# Patient Record
Sex: Female | Born: 1975
Health system: Southern US, Community
[De-identification: ages and names within clinical notes are randomized; demographics above are authoritative.]

## PROBLEM LIST (undated history)

## (undated) DIAGNOSIS — IMO0002 Reserved for concepts with insufficient information to code with codable children: Secondary | ICD-10-CM

## (undated) HISTORY — PX: OTHER SURGICAL HISTORY: SHX169

---

## 1999-03-09 ENCOUNTER — Other Ambulatory Visit: Admission: RE | Admit: 1999-03-09 | Discharge: 1999-03-09 | Payer: Self-pay | Admitting: Obstetrics and Gynecology

## 2000-03-25 ENCOUNTER — Other Ambulatory Visit: Admission: RE | Admit: 2000-03-25 | Discharge: 2000-03-25 | Payer: Self-pay | Admitting: *Deleted

## 2001-08-16 ENCOUNTER — Other Ambulatory Visit: Admission: RE | Admit: 2001-08-16 | Discharge: 2001-08-16 | Payer: Self-pay | Admitting: Obstetrics and Gynecology

## 2003-01-09 ENCOUNTER — Other Ambulatory Visit: Admission: RE | Admit: 2003-01-09 | Discharge: 2003-01-09 | Payer: Self-pay | Admitting: Obstetrics and Gynecology

## 2004-02-21 ENCOUNTER — Other Ambulatory Visit: Admission: RE | Admit: 2004-02-21 | Discharge: 2004-02-21 | Payer: Self-pay | Admitting: Obstetrics and Gynecology

## 2005-09-10 ENCOUNTER — Other Ambulatory Visit: Admission: RE | Admit: 2005-09-10 | Discharge: 2005-09-10 | Payer: Self-pay | Admitting: Obstetrics and Gynecology

## 2010-09-16 ENCOUNTER — Encounter: Admission: RE | Admit: 2010-09-16 | Discharge: 2010-09-16 | Payer: Self-pay | Admitting: Family Medicine

## 2011-07-10 ENCOUNTER — Emergency Department (HOSPITAL_COMMUNITY)
Admission: EM | Admit: 2011-07-10 | Discharge: 2011-07-11 | Disposition: A | Discharge status: Home / Self Care | Payer: 59 | Attending: Emergency Medicine | Admitting: Emergency Medicine

## 2011-07-10 DIAGNOSIS — W268XXA Contact with other sharp object(s), not elsewhere classified, initial encounter: Secondary | ICD-10-CM | POA: Insufficient documentation

## 2011-07-10 DIAGNOSIS — S51009A Unspecified open wound of unspecified elbow, initial encounter: Secondary | ICD-10-CM | POA: Insufficient documentation

## 2011-07-10 DIAGNOSIS — Y9316 Activity, rowing, canoeing, kayaking, rafting and tubing: Secondary | ICD-10-CM | POA: Insufficient documentation

## 2013-02-28 LAB — OB RESULTS CONSOLE RUBELLA ANTIBODY, IGM: Rubella: IMMUNE

## 2013-02-28 LAB — OB RESULTS CONSOLE RPR: RPR: NONREACTIVE

## 2013-06-20 ENCOUNTER — Other Ambulatory Visit: Payer: Self-pay

## 2013-08-08 ENCOUNTER — Ambulatory Visit (INDEPENDENT_AMBULATORY_CARE_PROVIDER_SITE_OTHER): Payer: BC Managed Care – PPO | Admitting: *Deleted

## 2013-08-08 ENCOUNTER — Encounter (HOSPITAL_COMMUNITY): Payer: Self-pay | Admitting: Obstetrics & Gynecology

## 2013-08-08 VITALS — BP 117/74

## 2013-08-08 DIAGNOSIS — O30009 Twin pregnancy, unspecified number of placenta and unspecified number of amniotic sacs, unspecified trimester: Secondary | ICD-10-CM

## 2013-08-08 NOTE — Progress Notes (Signed)
P = 91 

## 2013-08-08 NOTE — Addendum Note (Signed)
Addended by: Jill Side on: 08/08/2013 04:53 PM   Modules accepted: Orders

## 2013-08-10 ENCOUNTER — Other Ambulatory Visit (HOSPITAL_COMMUNITY): Payer: Self-pay | Admitting: Obstetrics & Gynecology

## 2013-08-10 DIAGNOSIS — IMO0001 Reserved for inherently not codable concepts without codable children: Secondary | ICD-10-CM

## 2013-08-10 DIAGNOSIS — O30002 Twin pregnancy, unspecified number of placenta and unspecified number of amniotic sacs, second trimester: Secondary | ICD-10-CM

## 2013-08-10 DIAGNOSIS — Z3689 Encounter for other specified antenatal screening: Secondary | ICD-10-CM

## 2013-08-15 ENCOUNTER — Ambulatory Visit (INDEPENDENT_AMBULATORY_CARE_PROVIDER_SITE_OTHER): Payer: BC Managed Care – PPO | Admitting: *Deleted

## 2013-08-15 ENCOUNTER — Other Ambulatory Visit (HOSPITAL_COMMUNITY): Payer: Self-pay | Admitting: Obstetrics & Gynecology

## 2013-08-15 ENCOUNTER — Ambulatory Visit (HOSPITAL_COMMUNITY)
Admission: RE | Admit: 2013-08-15 | Discharge: 2013-08-15 | Disposition: A | Discharge status: Home / Self Care | Payer: BC Managed Care – PPO | Source: Ambulatory Visit | Attending: Obstetrics & Gynecology | Admitting: Obstetrics & Gynecology

## 2013-08-15 ENCOUNTER — Ambulatory Visit (HOSPITAL_COMMUNITY): Admission: RE | Admit: 2013-08-15 | Payer: BC Managed Care – PPO | Source: Ambulatory Visit

## 2013-08-15 VITALS — BP 110/72 | HR 93 | Wt 179.5 lb

## 2013-08-15 VITALS — BP 103/71

## 2013-08-15 DIAGNOSIS — O358XX0 Maternal care for other (suspected) fetal abnormality and damage, not applicable or unspecified: Secondary | ICD-10-CM | POA: Insufficient documentation

## 2013-08-15 DIAGNOSIS — O30009 Twin pregnancy, unspecified number of placenta and unspecified number of amniotic sacs, unspecified trimester: Secondary | ICD-10-CM

## 2013-08-15 DIAGNOSIS — Z3689 Encounter for other specified antenatal screening: Secondary | ICD-10-CM

## 2013-08-15 DIAGNOSIS — O30002 Twin pregnancy, unspecified number of placenta and unspecified number of amniotic sacs, second trimester: Secondary | ICD-10-CM

## 2013-08-15 DIAGNOSIS — Z363 Encounter for antenatal screening for malformations: Secondary | ICD-10-CM | POA: Insufficient documentation

## 2013-08-15 DIAGNOSIS — O09519 Supervision of elderly primigravida, unspecified trimester: Secondary | ICD-10-CM | POA: Insufficient documentation

## 2013-08-15 DIAGNOSIS — Z1389 Encounter for screening for other disorder: Secondary | ICD-10-CM | POA: Insufficient documentation

## 2013-08-15 NOTE — Progress Notes (Signed)
Veronica Hicks  was seen today for an ultrasound appointment.  See full report in [facility].  Impression: DC/DA twin gestation with best dates of 33 1/7 weeks  Twin A: Maternal right, cephalic, anterior placenta Overall estimated fetal weight at the 20th %tile All biometric measurements < 10th %tile.  AC at the 8th %tile. Limited views of the anatomy obtained due late gestational age and fetal position - no anomalies noted UA Dopplers elevated for gestational age.  No absent or reversed diastolic flow Active fetus with BPP of 8/8 Normal amniotic fluid volume  Twin B: Maternal left, cephalic, anterior placenta Estimated fetal weight at 78th %tile Limited views of the anatomy obtained due to gestational age and fetal position - no anomalies noted. Active fetus with BPP of 8/8 Normal amniotic fluid volume  Recommendations: Recommend at least weekly BPPs with UA Dopplers Follow up growth scan in 3 weeks  If testing remains reassuring, recommend delivery at [redacted] weeks gestation.  Alpha Gula, MD

## 2013-08-15 NOTE — Progress Notes (Signed)
P = 88   Pt had [place] @ [facility]FM today.  Pt has prenatal visit appt today @ 1400.  Copy of NST report and tracing sent to Dr. Vincente Poli w/pt today.

## 2013-08-20 ENCOUNTER — Ambulatory Visit (HOSPITAL_COMMUNITY)
Admission: RE | Admit: 2013-08-20 | Discharge: 2013-08-20 | Disposition: A | Discharge status: Home / Self Care | Payer: BC Managed Care – PPO | Source: Ambulatory Visit | Attending: Obstetrics & Gynecology | Admitting: Obstetrics & Gynecology

## 2013-08-20 DIAGNOSIS — Z1389 Encounter for screening for other disorder: Secondary | ICD-10-CM | POA: Insufficient documentation

## 2013-08-20 DIAGNOSIS — O358XX0 Maternal care for other (suspected) fetal abnormality and damage, not applicable or unspecified: Secondary | ICD-10-CM | POA: Insufficient documentation

## 2013-08-20 DIAGNOSIS — O30009 Twin pregnancy, unspecified number of placenta and unspecified number of amniotic sacs, unspecified trimester: Secondary | ICD-10-CM | POA: Insufficient documentation

## 2013-08-20 DIAGNOSIS — O09519 Supervision of elderly primigravida, unspecified trimester: Secondary | ICD-10-CM | POA: Insufficient documentation

## 2013-08-20 DIAGNOSIS — Z363 Encounter for antenatal screening for malformations: Secondary | ICD-10-CM | POA: Insufficient documentation

## 2013-08-21 ENCOUNTER — Other Ambulatory Visit (HOSPITAL_COMMUNITY): Payer: Self-pay | Admitting: Maternal and Fetal Medicine

## 2013-08-21 DIAGNOSIS — O30002 Twin pregnancy, unspecified number of placenta and unspecified number of amniotic sacs, second trimester: Secondary | ICD-10-CM

## 2013-08-22 ENCOUNTER — Other Ambulatory Visit: Payer: Self-pay

## 2013-08-23 ENCOUNTER — Ambulatory Visit (HOSPITAL_COMMUNITY)
Admission: RE | Admit: 2013-08-23 | Discharge: 2013-08-23 | Disposition: A | Discharge status: Home / Self Care | Payer: BC Managed Care – PPO | Source: Ambulatory Visit | Attending: Obstetrics & Gynecology | Admitting: Obstetrics & Gynecology

## 2013-08-23 DIAGNOSIS — Z3689 Encounter for other specified antenatal screening: Secondary | ICD-10-CM | POA: Insufficient documentation

## 2013-08-23 DIAGNOSIS — O30002 Twin pregnancy, unspecified number of placenta and unspecified number of amniotic sacs, second trimester: Secondary | ICD-10-CM

## 2013-08-23 DIAGNOSIS — O30009 Twin pregnancy, unspecified number of placenta and unspecified number of amniotic sacs, unspecified trimester: Secondary | ICD-10-CM | POA: Insufficient documentation

## 2013-08-23 DIAGNOSIS — O30049 Twin pregnancy, dichorionic/diamniotic, unspecified trimester: Secondary | ICD-10-CM | POA: Insufficient documentation

## 2013-08-23 NOTE — Progress Notes (Signed)
Veronica Hicks  was seen today for an ultrasound appointment.  See full report in [facility].  Impression: DC/DA twin gestation at 65 2/7 weeks Lagging growth - Twin A  Twin A: BPP 8/8 Normal amniotic fluid volume UA Dopplers elevated for gestational age.  No absent or reversed diastolic flow.  Twin B: BPP 8/8 Normal amniotic fluid volume Normal UA Dopplers for gestational age  Recommendations: Recommend at least weekly BPPs with UA Dopplers Follow up growth scan in 2 weeks  If testing remains reassuring, recommend delivery at [redacted] weeks gestation.  Alpha Gula, MD

## 2013-08-24 ENCOUNTER — Other Ambulatory Visit: Payer: Self-pay

## 2013-08-27 ENCOUNTER — Inpatient Hospital Stay (HOSPITAL_COMMUNITY)
Admission: AD | Admit: 2013-08-27 | Discharge: 2013-08-27 | Disposition: A | Discharge status: Home / Self Care | Payer: BC Managed Care – PPO | Source: Ambulatory Visit | Attending: Obstetrics and Gynecology | Admitting: Obstetrics and Gynecology

## 2013-08-27 DIAGNOSIS — O30049 Twin pregnancy, dichorionic/diamniotic, unspecified trimester: Secondary | ICD-10-CM | POA: Insufficient documentation

## 2013-08-27 DIAGNOSIS — O365990 Maternal care for other known or suspected poor fetal growth, unspecified trimester, not applicable or unspecified: Secondary | ICD-10-CM | POA: Insufficient documentation

## 2013-08-27 DIAGNOSIS — O30009 Twin pregnancy, unspecified number of placenta and unspecified number of amniotic sacs, unspecified trimester: Secondary | ICD-10-CM | POA: Insufficient documentation

## 2013-08-28 ENCOUNTER — Encounter (HOSPITAL_COMMUNITY): Payer: Self-pay | Admitting: Pharmacist

## 2013-08-29 ENCOUNTER — Other Ambulatory Visit (HOSPITAL_COMMUNITY): Payer: Self-pay | Admitting: Maternal and Fetal Medicine

## 2013-08-29 DIAGNOSIS — O30002 Twin pregnancy, unspecified number of placenta and unspecified number of amniotic sacs, second trimester: Secondary | ICD-10-CM

## 2013-08-30 ENCOUNTER — Encounter (HOSPITAL_COMMUNITY): Payer: Self-pay

## 2013-08-30 ENCOUNTER — Ambulatory Visit (HOSPITAL_COMMUNITY)
Admission: RE | Admit: 2013-08-30 | Discharge: 2013-08-30 | Disposition: A | Discharge status: Home / Self Care | Payer: BC Managed Care – PPO | Source: Ambulatory Visit | Attending: Obstetrics and Gynecology | Admitting: Obstetrics and Gynecology

## 2013-08-30 VITALS — BP 129/82 | HR 82 | Wt 188.0 lb

## 2013-08-30 DIAGNOSIS — O09519 Supervision of elderly primigravida, unspecified trimester: Secondary | ICD-10-CM | POA: Insufficient documentation

## 2013-08-30 DIAGNOSIS — O30009 Twin pregnancy, unspecified number of placenta and unspecified number of amniotic sacs, unspecified trimester: Secondary | ICD-10-CM | POA: Insufficient documentation

## 2013-08-30 DIAGNOSIS — O30002 Twin pregnancy, unspecified number of placenta and unspecified number of amniotic sacs, second trimester: Secondary | ICD-10-CM

## 2013-08-30 NOTE — Progress Notes (Addendum)
Veronica Hicks  was seen today for an ultrasound appointment.  See full report in [facility].  Impression: DC/DA twin gestation at 88 2/7 weeks Lagging growth - Twin A  Twin A: BPP 8/8 Normal amniotic fluid volume UA Dopplers normal for gestational age.  Twin B: BPP 8/8 Normal amniotic fluid volume UA Dopplers elevated for gestational age, but no evidence of AEDF or REDF.  Recommendations: Recommend at least weekly BPPs with UA Dopplers Follow up growth scan in next week.  If testing remains reassuring and interval growth is appropriate, recommend delivery at [redacted] weeks gestation.  Alpha Gula, MD

## 2013-09-03 ENCOUNTER — Ambulatory Visit (HOSPITAL_COMMUNITY)
Admission: RE | Admit: 2013-09-03 | Discharge: 2013-09-03 | Disposition: A | Discharge status: Home / Self Care | Payer: BC Managed Care – PPO | Source: Ambulatory Visit | Attending: Obstetrics & Gynecology | Admitting: Obstetrics & Gynecology

## 2013-09-03 DIAGNOSIS — O09519 Supervision of elderly primigravida, unspecified trimester: Secondary | ICD-10-CM | POA: Insufficient documentation

## 2013-09-03 DIAGNOSIS — O30009 Twin pregnancy, unspecified number of placenta and unspecified number of amniotic sacs, unspecified trimester: Secondary | ICD-10-CM | POA: Insufficient documentation

## 2013-09-06 ENCOUNTER — Other Ambulatory Visit (HOSPITAL_COMMUNITY): Payer: Self-pay | Admitting: Maternal and Fetal Medicine

## 2013-09-06 ENCOUNTER — Ambulatory Visit (HOSPITAL_COMMUNITY)
Admission: RE | Admit: 2013-09-06 | Discharge: 2013-09-06 | Disposition: A | Discharge status: Home / Self Care | Payer: BC Managed Care – PPO | Source: Ambulatory Visit | Attending: Obstetrics & Gynecology | Admitting: Obstetrics & Gynecology

## 2013-09-06 DIAGNOSIS — O30002 Twin pregnancy, unspecified number of placenta and unspecified number of amniotic sacs, second trimester: Secondary | ICD-10-CM

## 2013-09-06 DIAGNOSIS — O30009 Twin pregnancy, unspecified number of placenta and unspecified number of amniotic sacs, unspecified trimester: Secondary | ICD-10-CM | POA: Insufficient documentation

## 2013-09-06 DIAGNOSIS — O365990 Maternal care for other known or suspected poor fetal growth, unspecified trimester, not applicable or unspecified: Secondary | ICD-10-CM | POA: Insufficient documentation

## 2013-09-10 ENCOUNTER — Encounter (HOSPITAL_COMMUNITY): Payer: Self-pay | Admitting: Anesthesiology

## 2013-09-10 NOTE — Anesthesia Preprocedure Evaluation (Addendum)
Anesthesia Evaluation  Patient identified by MRN, date of birth, ID band Patient awake    Reviewed: Allergy & Precautions, H&P , NPO status , Patient's Chart, lab work & pertinent test results  Airway Mallampati: III TM Distance: >3 FB Neck ROM: Full    Dental no notable dental hx. (+) Teeth Intact   Pulmonary neg pulmonary ROS,  breath sounds clear to auscultation  Pulmonary exam normal       Cardiovascular negative cardio ROS  Rhythm:Regular Rate:Normal     Neuro/Psych negative neurological ROS  negative psych ROS   GI/Hepatic Neg liver ROS, GERD-  ,  Endo/Other  negative endocrine ROS  Renal/GU negative Renal ROS  negative genitourinary   Musculoskeletal negative musculoskeletal ROS (+)   Abdominal (+) + obese,   Peds  Hematology negative hematology ROS (+)   Anesthesia Other Findings   Reproductive/Obstetrics (+) Pregnancy Twin Gestation 37 weeks Twin A with IUGR                          Anesthesia Physical Anesthesia Plan  ASA: II  Anesthesia Plan: Spinal   Post-op Pain Management:    Induction:   Airway Management Planned: Natural Airway  Additional Equipment:   Intra-op Plan:   Post-operative Plan:   Informed Consent: I have reviewed the patients History and Physical, chart, labs and discussed the procedure including the risks, benefits and alternatives for the proposed anesthesia with the patient or authorized representative who has indicated his/her understanding and acceptance.   Dental advisory given  Plan Discussed with: Anesthesiologist, Surgeon and CRNA  Anesthesia Plan Comments:         Anesthesia Quick Evaluation

## 2013-09-11 ENCOUNTER — Encounter (HOSPITAL_COMMUNITY): Payer: Self-pay | Admitting: *Deleted

## 2013-09-11 ENCOUNTER — Encounter (HOSPITAL_COMMUNITY): Payer: Self-pay | Admitting: Anesthesiology

## 2013-09-11 ENCOUNTER — Encounter (HOSPITAL_COMMUNITY)
Admission: RE | Disposition: A | Discharge status: Home / Self Care | Payer: Self-pay | Source: Ambulatory Visit | Attending: Obstetrics and Gynecology

## 2013-09-11 ENCOUNTER — Inpatient Hospital Stay (HOSPITAL_COMMUNITY): Payer: BC Managed Care – PPO | Admitting: Anesthesiology

## 2013-09-11 ENCOUNTER — Inpatient Hospital Stay (HOSPITAL_COMMUNITY)
Admission: RE | Admit: 2013-09-11 | Discharge: 2013-09-14 | DRG: 370 | Disposition: A | Discharge status: Home / Self Care | Payer: BC Managed Care – PPO | Source: Ambulatory Visit | Attending: Obstetrics and Gynecology | Admitting: Obstetrics and Gynecology

## 2013-09-11 DIAGNOSIS — O09519 Supervision of elderly primigravida, unspecified trimester: Secondary | ICD-10-CM | POA: Diagnosis present

## 2013-09-11 DIAGNOSIS — O9903 Anemia complicating the puerperium: Secondary | ICD-10-CM | POA: Diagnosis not present

## 2013-09-11 DIAGNOSIS — O365990 Maternal care for other known or suspected poor fetal growth, unspecified trimester, not applicable or unspecified: Secondary | ICD-10-CM | POA: Diagnosis present

## 2013-09-11 DIAGNOSIS — O30009 Twin pregnancy, unspecified number of placenta and unspecified number of amniotic sacs, unspecified trimester: Secondary | ICD-10-CM

## 2013-09-11 DIAGNOSIS — O09819 Supervision of pregnancy resulting from assisted reproductive technology, unspecified trimester: Secondary | ICD-10-CM

## 2013-09-11 DIAGNOSIS — D649 Anemia, unspecified: Secondary | ICD-10-CM | POA: Diagnosis not present

## 2013-09-11 HISTORY — DX: Reserved for concepts with insufficient information to code with codable children: IMO0002

## 2013-09-11 LAB — TYPE AND SCREEN: Antibody Screen: NEGATIVE

## 2013-09-11 LAB — CBC
MCH: 29.9 pg (ref 26.0–34.0)
Platelets: 121 10*3/uL — ABNORMAL LOW (ref 150–400)
RBC: 4.18 MIL/uL (ref 3.87–5.11)

## 2013-09-11 LAB — RPR: RPR Ser Ql: NONREACTIVE

## 2013-09-11 LAB — ABO/RH: ABO/RH(D): O POS

## 2013-09-11 SURGERY — Surgical Case
Anesthesia: Spinal | Site: Abdomen | Wound class: Clean Contaminated

## 2013-09-11 MED ORDER — MORPHINE SULFATE (PF) 0.5 MG/ML IJ SOLN
INTRAMUSCULAR | Status: DC | PRN
  Administered 2013-09-11: .15 mg via EPIDURAL

## 2013-09-11 MED ORDER — KETOROLAC TROMETHAMINE 30 MG/ML IJ SOLN: 30.0000 mg | Freq: Four times a day (QID) | INTRAMUSCULAR | Status: DC | PRN

## 2013-09-11 MED ORDER — ONDANSETRON HCL 4 MG/2ML IJ SOLN
INTRAMUSCULAR | Status: DC | PRN
  Administered 2013-09-11: 4 mg via INTRAVENOUS

## 2013-09-11 MED ORDER — FENTANYL CITRATE 0.05 MG/ML IJ SOLN
INTRAMUSCULAR | Status: AC
  Filled 2013-09-11: qty 2

## 2013-09-11 MED ORDER — PHENYLEPHRINE 40 MCG/ML (10ML) SYRINGE FOR IV PUSH (FOR BLOOD PRESSURE SUPPORT)
PREFILLED_SYRINGE | INTRAVENOUS | Status: AC
  Filled 2013-09-11: qty 5

## 2013-09-11 MED ORDER — OXYTOCIN 10 UNIT/ML IJ SOLN
INTRAMUSCULAR | Status: AC
  Filled 2013-09-11: qty 4

## 2013-09-11 MED ORDER — OXYTOCIN 10 UNIT/ML IJ SOLN
40.0000 [IU] | INTRAVENOUS | Status: DC | PRN
  Administered 2013-09-11: 40 [IU] via INTRAVENOUS

## 2013-09-11 MED ORDER — DIPHENHYDRAMINE HCL 25 MG PO CAPS
25.0000 mg | ORAL_CAPSULE | ORAL | Status: DC | PRN
  Filled 2013-09-11: qty 1

## 2013-09-11 MED ORDER — ONDANSETRON HCL 4 MG/2ML IJ SOLN
4.0000 mg | INTRAMUSCULAR | Status: DC | PRN
  Administered 2013-09-11: 4 mg via INTRAVENOUS
  Filled 2013-09-11: qty 2

## 2013-09-11 MED ORDER — NALBUPHINE HCL 10 MG/ML IJ SOLN
5.0000 mg | INTRAMUSCULAR | Status: DC | PRN
  Filled 2013-09-11: qty 1

## 2013-09-11 MED ORDER — NALOXONE HCL 0.4 MG/ML IJ SOLN: 0.4000 mg | INTRAMUSCULAR | Status: DC | PRN

## 2013-09-11 MED ORDER — CEFAZOLIN SODIUM-DEXTROSE 2-3 GM-% IV SOLR
INTRAVENOUS | Status: AC
  Filled 2013-09-11: qty 50

## 2013-09-11 MED ORDER — ONDANSETRON HCL 4 MG PO TABS: 4.0000 mg | ORAL_TABLET | ORAL | Status: DC | PRN

## 2013-09-11 MED ORDER — BUPIVACAINE IN DEXTROSE 0.75-8.25 % IT SOLN
INTRATHECAL | Status: DC | PRN
  Administered 2013-09-11: 1.6 mL via INTRATHECAL

## 2013-09-11 MED ORDER — SIMETHICONE 80 MG PO CHEW
80.0000 mg | CHEWABLE_TABLET | Freq: Three times a day (TID) | ORAL | Status: DC
  Administered 2013-09-12 – 2013-09-13 (×6): 80 mg via ORAL

## 2013-09-11 MED ORDER — FENTANYL CITRATE 0.05 MG/ML IJ SOLN
INTRAMUSCULAR | Status: DC | PRN
  Administered 2013-09-11: 25 ug via INTRATHECAL

## 2013-09-11 MED ORDER — FENTANYL CITRATE 0.05 MG/ML IJ SOLN: 25.0000 ug | INTRAMUSCULAR | Status: DC | PRN

## 2013-09-11 MED ORDER — ONDANSETRON HCL 4 MG/2ML IJ SOLN
INTRAMUSCULAR | Status: AC
  Filled 2013-09-11: qty 2

## 2013-09-11 MED ORDER — DIBUCAINE 1 % RE OINT: 1.0000 "application " | TOPICAL_OINTMENT | RECTAL | Status: DC | PRN

## 2013-09-11 MED ORDER — METOCLOPRAMIDE HCL 5 MG/ML IJ SOLN: 10.0000 mg | Freq: Once | INTRAMUSCULAR | Status: DC | PRN

## 2013-09-11 MED ORDER — DIPHENHYDRAMINE HCL 25 MG PO CAPS: 25.0000 mg | ORAL_CAPSULE | Freq: Four times a day (QID) | ORAL | Status: DC | PRN

## 2013-09-11 MED ORDER — WITCH HAZEL-GLYCERIN EX PADS: 1.0000 "application " | MEDICATED_PAD | CUTANEOUS | Status: DC | PRN

## 2013-09-11 MED ORDER — LACTATED RINGERS IV SOLN
INTRAVENOUS | Status: DC | PRN
  Administered 2013-09-11: 08:00:00 via INTRAVENOUS

## 2013-09-11 MED ORDER — SCOPOLAMINE 1 MG/3DAYS TD PT72: 1.0000 | MEDICATED_PATCH | Freq: Once | TRANSDERMAL | Status: DC

## 2013-09-11 MED ORDER — BUPIVACAINE HCL (PF) 0.25 % IJ SOLN
INTRAMUSCULAR | Status: AC
  Filled 2013-09-11: qty 30

## 2013-09-11 MED ORDER — SODIUM CHLORIDE 0.9 % IJ SOLN: 3.0000 mL | INTRAMUSCULAR | Status: DC | PRN

## 2013-09-11 MED ORDER — OXYTOCIN 40 UNITS IN LACTATED RINGERS INFUSION - SIMPLE MED: 62.5000 mL/h | INTRAVENOUS | Status: AC

## 2013-09-11 MED ORDER — SCOPOLAMINE 1 MG/3DAYS TD PT72
MEDICATED_PATCH | TRANSDERMAL | Status: AC
  Filled 2013-09-11: qty 1

## 2013-09-11 MED ORDER — BUPIVACAINE HCL (PF) 0.25 % IJ SOLN
INTRAMUSCULAR | Status: DC | PRN
  Administered 2013-09-11: 10 mL

## 2013-09-11 MED ORDER — MEPERIDINE HCL 25 MG/ML IJ SOLN: 6.2500 mg | INTRAMUSCULAR | Status: DC | PRN

## 2013-09-11 MED ORDER — SIMETHICONE 80 MG PO CHEW: 80.0000 mg | CHEWABLE_TABLET | ORAL | Status: DC | PRN

## 2013-09-11 MED ORDER — MENTHOL 3 MG MT LOZG: 1.0000 | LOZENGE | OROMUCOSAL | Status: DC | PRN

## 2013-09-11 MED ORDER — OXYCODONE-ACETAMINOPHEN 5-325 MG PO TABS: 1.0000 | ORAL_TABLET | ORAL | Status: DC | PRN

## 2013-09-11 MED ORDER — TETANUS-DIPHTH-ACELL PERTUSSIS 5-2.5-18.5 LF-MCG/0.5 IM SUSP
0.5000 mL | Freq: Once | INTRAMUSCULAR | Status: AC
  Administered 2013-09-12: 0.5 mL via INTRAMUSCULAR
  Filled 2013-09-11: qty 0.5

## 2013-09-11 MED ORDER — ONDANSETRON HCL 4 MG/2ML IJ SOLN: 4.0000 mg | Freq: Three times a day (TID) | INTRAMUSCULAR | Status: DC | PRN

## 2013-09-11 MED ORDER — MORPHINE SULFATE 0.5 MG/ML IJ SOLN
INTRAMUSCULAR | Status: AC
  Filled 2013-09-11: qty 10

## 2013-09-11 MED ORDER — LANOLIN HYDROUS EX OINT: 1.0000 "application " | TOPICAL_OINTMENT | CUTANEOUS | Status: DC | PRN

## 2013-09-11 MED ORDER — CEFAZOLIN (ANCEF) 1 G IV SOLR
2.0000 g | INTRAVENOUS | Status: DC
  Administered 2013-09-11: 2 g
  Filled 2013-09-11: qty 2

## 2013-09-11 MED ORDER — EPHEDRINE SULFATE 50 MG/ML IJ SOLN
INTRAMUSCULAR | Status: DC | PRN
  Administered 2013-09-11 (×4): 10 mg via INTRAVENOUS

## 2013-09-11 MED ORDER — ZOLPIDEM TARTRATE 5 MG PO TABS: 5.0000 mg | ORAL_TABLET | Freq: Every evening | ORAL | Status: DC | PRN

## 2013-09-11 MED ORDER — NALOXONE HCL 1 MG/ML IJ SOLN
1.0000 ug/kg/h | INTRAVENOUS | Status: DC | PRN
  Filled 2013-09-11: qty 2

## 2013-09-11 MED ORDER — DIPHENHYDRAMINE HCL 50 MG/ML IJ SOLN: 25.0000 mg | INTRAMUSCULAR | Status: DC | PRN

## 2013-09-11 MED ORDER — ACETAMINOPHEN 160 MG/5ML PO SOLN
ORAL | Status: AC
  Filled 2013-09-11: qty 40.6

## 2013-09-11 MED ORDER — ACETAMINOPHEN 160 MG/5ML PO SOLN
975.0000 mg | Freq: Four times a day (QID) | ORAL | Status: DC | PRN
  Administered 2013-09-11: 975 mg via ORAL

## 2013-09-11 MED ORDER — SENNOSIDES-DOCUSATE SODIUM 8.6-50 MG PO TABS
2.0000 | ORAL_TABLET | ORAL | Status: DC
  Administered 2013-09-11 – 2013-09-13 (×2): 2 via ORAL

## 2013-09-11 MED ORDER — EPHEDRINE 5 MG/ML INJ
INTRAVENOUS | Status: AC
  Filled 2013-09-11: qty 10

## 2013-09-11 MED ORDER — PHENYLEPHRINE HCL 10 MG/ML IJ SOLN
INTRAMUSCULAR | Status: DC | PRN
  Administered 2013-09-11: 40 ug via INTRAVENOUS
  Administered 2013-09-11 (×2): 80 ug via INTRAVENOUS
  Administered 2013-09-11: 40 ug via INTRAVENOUS
  Administered 2013-09-11 (×2): 80 ug via INTRAVENOUS

## 2013-09-11 MED ORDER — CEFAZOLIN SODIUM-DEXTROSE 2-3 GM-% IV SOLR
2.0000 g | INTRAVENOUS | Status: DC
  Filled 2013-09-11: qty 50

## 2013-09-11 MED ORDER — DIPHENHYDRAMINE HCL 50 MG/ML IJ SOLN: 12.5000 mg | INTRAMUSCULAR | Status: DC | PRN

## 2013-09-11 MED ORDER — METOCLOPRAMIDE HCL 5 MG/ML IJ SOLN: 10.0000 mg | Freq: Three times a day (TID) | INTRAMUSCULAR | Status: DC | PRN

## 2013-09-11 MED ORDER — IBUPROFEN 600 MG PO TABS
600.0000 mg | ORAL_TABLET | Freq: Four times a day (QID) | ORAL | Status: DC
  Administered 2013-09-11 – 2013-09-14 (×11): 600 mg via ORAL
  Filled 2013-09-11 (×11): qty 1

## 2013-09-11 MED ORDER — PRENATAL MULTIVITAMIN CH
1.0000 | ORAL_TABLET | Freq: Every day | ORAL | Status: DC
  Administered 2013-09-12 – 2013-09-14 (×3): 1 via ORAL
  Filled 2013-09-11 (×3): qty 1

## 2013-09-11 MED ORDER — SIMETHICONE 80 MG PO CHEW
80.0000 mg | CHEWABLE_TABLET | ORAL | Status: DC
  Administered 2013-09-11: 80 mg via ORAL

## 2013-09-11 MED ORDER — LACTATED RINGERS IV SOLN
INTRAVENOUS | Status: DC
  Administered 2013-09-11: 20:00:00 via INTRAVENOUS

## 2013-09-11 MED ORDER — LACTATED RINGERS IV SOLN
INTRAVENOUS | Status: DC
  Administered 2013-09-11 (×4): via INTRAVENOUS

## 2013-09-11 SURGICAL SUPPLY — 41 items
ADH SKN CLS APL DERMABOND .7 (GAUZE/BANDAGES/DRESSINGS) ×1
BARRIER ADHS 3X4 INTERCEED (GAUZE/BANDAGES/DRESSINGS) IMPLANT
BRR ADH 4X3 ABS CNTRL BYND (GAUZE/BANDAGES/DRESSINGS)
CLAMP CORD UMBIL (MISCELLANEOUS) IMPLANT
CLEANER TIP ELECTROSURG 2X2 (MISCELLANEOUS) ×2 IMPLANT
CLOTH BEACON ORANGE TIMEOUT ST (SAFETY) ×2 IMPLANT
CONTAINER PREFILL 10% NBF 15ML (MISCELLANEOUS) IMPLANT
DERMABOND ADVANCED (GAUZE/BANDAGES/DRESSINGS) ×1
DERMABOND ADVANCED .7 DNX12 (GAUZE/BANDAGES/DRESSINGS) IMPLANT
DEVICE BLD TRNS LUER ATTCH (MISCELLANEOUS) ×2 IMPLANT
DRAPE LG THREE QUARTER DISP (DRAPES) ×4 IMPLANT
DRSG OPSITE POSTOP 4X10 (GAUZE/BANDAGES/DRESSINGS) IMPLANT
DURAPREP 26ML APPLICATOR (WOUND CARE) ×2 IMPLANT
ELECT REM PT RETURN 9FT ADLT (ELECTROSURGICAL) ×2
ELECTRODE REM PT RTRN 9FT ADLT (ELECTROSURGICAL) ×1 IMPLANT
EXTRACTOR VACUUM M CUP 4 TUBE (SUCTIONS) ×1 IMPLANT
GAUZE SPONGE 4X4 12PLY STRL LF (GAUZE/BANDAGES/DRESSINGS) ×2 IMPLANT
GLOVE BIO SURGEON STRL SZ 6.5 (GLOVE) ×2 IMPLANT
GOWN PREVENTION PLUS XLARGE (GOWN DISPOSABLE) ×4 IMPLANT
GOWN STRL REIN XL XLG (GOWN DISPOSABLE) ×4 IMPLANT
KIT ABG SYR 3ML LUER SLIP (SYRINGE) IMPLANT
NDL HYPO 25X5/8 SAFETYGLIDE (NEEDLE) ×1 IMPLANT
NDL SAFETY ECLIPSE 18X1.5 (NEEDLE) IMPLANT
NEEDLE HYPO 18GX1.5 SHARP (NEEDLE) ×4
NEEDLE HYPO 22GX1.5 SAFETY (NEEDLE) ×2 IMPLANT
NEEDLE HYPO 25X5/8 SAFETYGLIDE (NEEDLE) ×2 IMPLANT
NS IRRIG 1000ML POUR BTL (IV SOLUTION) ×2 IMPLANT
PACK C SECTION WH (CUSTOM PROCEDURE TRAY) ×2 IMPLANT
PAD OB MATERNITY 4.3X12.25 (PERSONAL CARE ITEMS) ×2 IMPLANT
PENCIL BUTTON HOLSTER BLD 10FT (ELECTRODE) ×2 IMPLANT
STAPLER VISISTAT 35W (STAPLE) IMPLANT
SUT CHROMIC 0 CTX 36 (SUTURE) ×4 IMPLANT
SUT PLAIN 0 NONE (SUTURE) IMPLANT
SUT PLAIN 2 0 XLH (SUTURE) IMPLANT
SUT VIC AB 0 CT1 27 (SUTURE) ×6
SUT VIC AB 0 CT1 27XBRD ANBCTR (SUTURE) ×3 IMPLANT
SUT VIC AB 4-0 KS 27 (SUTURE) ×1 IMPLANT
SYR CONTROL 10ML LL (SYRINGE) IMPLANT
TOWEL OR 17X24 6PK STRL BLUE (TOWEL DISPOSABLE) ×2 IMPLANT
TRAY FOLEY CATH 14FR (SET/KITS/TRAYS/PACK) ×2 IMPLANT
WATER STERILE IRR 1000ML POUR (IV SOLUTION) ×2 IMPLANT

## 2013-09-11 NOTE — Op Note (Signed)
Veronica Hicks, Veronica Hicks                ACCOUNT NO.:  [id number]  MEDICAL RECORD NO.:  [id number]  LOCATION:  [facility]                          [facility]ACILITY:  [facility]  PHYSICIAN:  Tenley Winward L. Geralyn Figiel, M.D.DATE OF BIRTH:  26-Oct-1976  DATE OF PROCEDURE:  09/11/2013 DATE OF DISCHARGE:                              OPERATIVE REPORT   PREOPERATIVE DIAGNOSES: 1. Twin IUP at 37 weeks. 2. IUGR, twin A.  POSTOPERATIVE DIAGNOSES: 1. Twin IUP at 37 weeks. 2. IUGR, twin A.  PROCEDURE:  Primary low transverse cesarean section.  SURGEON:  Senita Corredor L. Vincente Poli, M.D.  ANESTHESIA:  Spinal.  EBL:  3000 mL.  800 mL of amniotic fluid.  COMPLICATIONS:  None.  PATHOLOGY:  Placenta x2.  DRAINS:  Foley catheter.  DESCRIPTION OF PROCEDURE:  The patient was taken to the operating room after she was consented about the risk associated with the procedure.  A spinal was administered by Dr. Jean Rosenthal without incident.  She was then prepped and draped in usual sterile fashion.  A Foley catheter was inserted.  Time-out was performed.  A low transverse incision was made, carried down to the fascia.  Fascia scored in the midline, extended laterally.  The rectus muscles were separated in the midline.  The peritoneum was entered bluntly.  The peritoneal incision was then stretched.  The bladder flap was created in standard fashion using sharp and blunt dissection.  The bladder blade was inserted.  Lower uterine segment was identified.  A low transverse incision was then made in the uterus.  Upon entry into the uterus, baby A was noted to be cephalic. We did notice a very large bleeder on the left corner of the incision which was hemostatic after I placed a ring forceps at the area.  Baby A was delivered in cephalic presentation, was delivered easily with a female infant, and there was a loose nuchal cord x1.  The baby was vigorous in the operating room and was given to the neonatal team. After delivery of baby A, we could  see the sac of baby B, protruding through the incision.  The sac was then broken and the amniotic fluid was clear.  The baby was in cephalic presentation, was delivered easily with 1 gentle pull of the vacuum without a pop off.  The baby was a female infant, was also handed to the neonatal team.  The placentas were manually removed after cord blood was obtained from each umbilical cord. The uterus was exteriorized and cleared of all clots and debris. Inspection of the ovaries, I could see that she had streak gonads bilaterally which was consistent with her history of premature ovarian failure.  Fallopian tubes appeared normal.  The uterus was closed using 2 layers of 0 chromic in a running, locked stitch.  The uterus was returned to the abdomen, irrigation was performed.  The peritoneum was closed using 0 Vicryl.  The fascia was closed using 0 Vicryl.  After irrigation of subcutaneous layer and noting hemostasis, the skin was closed with 4-0 Vicryl on a Keith needle.  Local was infiltrated, Dermabond was applied.  All sponge, lap, and instrument counts were correct x2.  The patient went to recovery  room in stable condition.     [name] L. Vincente Poli, M.D.     [name]  D:  09/11/2013  T:  09/11/2013  Job:  [id number]

## 2013-09-11 NOTE — Lactation Note (Signed)
This note was copied from the chart of [name]. Lactation Consultation Note  Patient Name: Veronica Hicks RUEAV'W Date: 09/11/2013 Reason for consult: Follow-up assessment;Difficult latch;Infant < 6lbs;Late preterm infant;Multiple gestation.  This twin had latched previously for a few sucks and had some lowering of her temperature but is stable now.  She is sound asleep and wrapped in blanket but quickly arouses when unwrapped and is able to latch to mom's (R) breast for about 10 total minutes using #20 NS.  Mom has everted, "button-like" nipples which are short and tend to flatten when breast is compressed.  LC provided #20 NS for each twin, as well as curved-tip syringes for feeding small amounts of ebm when available.  There was some clear colostrum seen in tip of NS at end of feeding and baby had several strong sucking bursts, a few swallows and she came off on her own and was asleep/STS.     Maternal Data  IVF twins born at 56 weeks  Feeding Feeding Type: Breast Milk Length of feed: 10 min  LATCH Score/Interventions Latch: Repeated attempts needed to sustain latch, nipple held in mouth throughout feeding, stimulation needed to elicit sucking reflex. (latched fairly well with NS, slipped off and re-latched) Intervention(s): Skin to skin;Teach feeding cues;Waking techniques Intervention(s): Adjust position;Assist with latch;Breast compression  Audible Swallowing: A few with stimulation Intervention(s): Skin to skin;Hand expression Intervention(s): Skin to skin;Hand expression  Type of Nipple: Everted at rest and after stimulation (short nipples which flatten w/breast compression) Intervention(s): Double electric pump  Comfort (Breast/Nipple): Soft / non-tender     Hold (Positioning): Assistance needed to correctly position infant at breast and maintain latch. Intervention(s): Breastfeeding basics reviewed;Support Pillows;Position options;Skin to skin (encouraged use  of football position)  [facility] Score: 7  Lactation Tools Discussed/Used Tools: Nipple Dorris Carnes;Other (comment) (curved tip syringes) Nipple shield size: 20 Pump Review: Setup, frequency, and cleaning;Milk Storage Initiated by:: NT assembled and mom will need assistance with first pumping Date initiated:: 09/11/13   Consult Status Consult Status: Follow-up Date: 09/11/13 Follow-up type: In-patient    Warrick Parisian [facility] 09/11/2013, 6:13 PM

## 2013-09-11 NOTE — H&P (Signed)
37 year old G 1 P 0 at 37 weeks with IVF Twin Gestation. Presents for Primary LTCS  Twin gestation complicated by IUGR Baby A. MFM recommended Delivery at 37 weeks. Baby B oblique  Afebrile VSS General alert and oriented Lung CTAB Car RRR Abdomen gravid  IMPRESSION: Twin IUP at 37 weeks IUGR A BABY B Oblique  PLAN: Primary LTCS Consent signed

## 2013-09-11 NOTE — Brief Op Note (Signed)
09/11/2013  9:03 AM  PATIENT:  Veronica Hicks  37 y.o. female  PRE-OPERATIVE DIAGNOSIS:  Twin IUP at 37 weeks IUGR Twin 1  POST-OPERATIVE DIAGNOSIS:  Same  PROCEDURE:  Procedure(s): CESAREAN SECTION (N/A) Primary Low Transverse   SURGEON:  Surgeon(s) and Role:    * Jeani Hawking, MD - Primary  PHYSICIAN ASSISTANT:   ASSISTANTS: none   ANESTHESIA:   spinal  EBL:  Total I/O In: 3400 [I.V.:3400] Out: 3150 [Urine:150; Blood:3000]  BLOOD ADMINISTERED:none  DRAINS: Urinary Catheter (Foley)   LOCAL MEDICATIONS USED:  MARCAINE     SPECIMEN:  Source of Specimen:  placenta x 2  DISPOSITION OF SPECIMEN:  PATHOLOGY  COUNTS:  YES  TOURNIQUET:  * No tourniquets in log *  DICTATION: .Other Dictation: Dictation Number dictated  PLAN OF CARE: Admit to inpatient   PATIENT DISPOSITION:  PACU - hemodynamically stable.   Delay start of Pharmacological VTE agent (>24hrs) due to surgical blood loss or risk of bleeding: not applicable

## 2013-09-11 NOTE — Transfer of Care (Signed)
Immediate Anesthesia Transfer of Care Note  Patient: Veronica Hicks  Procedure(s) Performed: Procedure(s): CESAREAN SECTION (N/A)  Patient Location: PACU  Anesthesia Type:Spinal  Level of Consciousness: awake  Airway & Oxygen Therapy: Patient Spontanous Breathing  Post-op Assessment: Report given to PACU RN  Post vital signs: Reviewed and stable  Complications: No apparent anesthesia complications

## 2013-09-11 NOTE — Preoperative (Signed)
Beta Blockers   Reason not to administer Beta Blockers:Not Applicable 

## 2013-09-11 NOTE — OR Nursing (Signed)
No op site used as a dressing.  Only dermabond. Adjusted the supplies list.  Removed op site from supplies list

## 2013-09-11 NOTE — Anesthesia Procedure Notes (Signed)
Spinal  Patient location during procedure: OR Start time: 09/11/2013 7:57 AM Staffing Anesthesiologist: Angus Seller., Harrell Gave. Performed by: anesthesiologist  Preanesthetic Checklist Completed: patient identified, site marked, surgical consent, pre-op evaluation, timeout performed, IV checked, risks and benefits discussed and monitors and equipment checked Spinal Block Patient position: sitting Prep: DuraPrep Patient monitoring: heart rate, cardiac monitor, continuous pulse ox and blood pressure Approach: midline Location: L3-4 Injection technique: single-shot Needle Needle type: Sprotte  Needle gauge: 24 G Needle length: 9 cm Assessment Sensory level: T4 Additional Notes Patient identified.  Risk benefits discussed including failed block, incomplete pain control, headache, nerve damage, paralysis, blood pressure changes, nausea, vomiting, reactions to medication both toxic or allergic, and postpartum back pain.  Patient expressed understanding and wished to proceed.  All questions were answered.  Sterile technique used throughout procedure.  CSF was clear.  No parasthesia or other complications.  Please see nursing notes for vital signs.

## 2013-09-11 NOTE — Anesthesia Postprocedure Evaluation (Signed)
  Anesthesia Post-op Note  Anesthesia Post Note  Patient: Veronica Hicks  Procedure(s) Performed: Procedure(s) (LRB): CESAREAN SECTION (N/A)  Anesthesia type: Spinal  Patient location: PACU  Post pain: Pain level controlled  Post assessment: Post-op Vital signs reviewed  Last Vitals:  Filed Vitals:   09/11/13 0930  BP: 103/68  Pulse: 94  Temp:   Resp: 22    Post vital signs: Reviewed  Level of consciousness: awake  Complications: No apparent anesthesia complications

## 2013-09-11 NOTE — Lactation Note (Signed)
This note was copied from the chart of [name]. Lactation Consultation Note  Patient Name: Veronica Hicks JXBJY'N Date: 09/11/2013 Reason for consult: Follow-up assessment;Difficult latch;Late preterm infant;Multiple gestation Mom has everted, "button-like" nipples which are short and tend to flatten when breast is compressed. LC provided #20 NS for each twin, as well as curved-tip syringes for feeding small amounts of ebm when available.  This twin had not yet fed and was asleep and wrapped in blankets but is arousable and does achieve areolar grasp with #20 NS, is able to suck a few times with stimulation but is spitty/gaggy and sleepy, so LC recommends mom try for 10-15 minutes with each twin at least every 3 hours but sooner if feeding cues observed.  Mom has DEBP and needs assistance to initiate pumping later but is tired after attempting to feed both babies, so LC encouraged her to rest while her twin sister and FOB hold twins STS and will assist later with latching and/or pumping if RN unable to assist.    Maternal Data    Feeding Feeding Type: Breast Milk Length of feed: 5 min (on breast 15" sucked for 5")  LATCH Score/Interventions Latch: Repeated attempts needed to sustain latch, nipple held in mouth throughout feeding, stimulation needed to elicit sucking reflex. (used #20 NS, baby grasps areola, a few sucks w/stimulation) Intervention(s): Skin to skin;Teach feeding cues;Waking techniques Intervention(s): Adjust position;Assist with latch;Breast compression  Audible Swallowing: A few with stimulation (small amount of colostrum in NS) Intervention(s): Skin to skin;Hand expression Intervention(s): Skin to skin;Hand expression;Alternate breast massage  Type of Nipple: Everted at rest and after stimulation (flatten with breast compression)  Comfort (Breast/Nipple): Soft / non-tender     Hold (Positioning): Assistance needed to correctly position infant at breast  and maintain latch. Intervention(s): Breastfeeding basics reviewed;Support Pillows;Position options;Skin to skin (recommend football position)  [facility] Score: 7  Lactation Tools Discussed/Used Tools: Nipple Dorris Carnes;Other (comment) (curved-tip syringes) Nipple shield size: 20 Pump Review: Setup, frequency, and cleaning;Milk Storage Initiated by:: NT assembled but mom will need assistance with first time pumping Date initiated:: 09/11/13 STS, cue feedings but at least every 3 hour attempts and use of DEBP several times tonight and at least 4 times per 24 hours  Consult Status Consult Status: Follow-up Date: 09/11/13 Follow-up type: In-patient    Warrick Parisian [facility] 09/11/2013, 6:22 PM

## 2013-09-11 NOTE — Anesthesia Postprocedure Evaluation (Signed)
  Anesthesia Post-op Note  Patient: Veronica Hicks  Procedure(s) Performed: Procedure(s): CESAREAN SECTION (N/A)  Patient Location: PACU and Mother/Baby  Anesthesia Type:Spinal  Level of Consciousness: awake, alert , oriented and patient cooperative  Airway and Oxygen Therapy: Patient Spontanous Breathing  Post-op Pain: none  Post-op Assessment: Post-op Vital signs reviewed, Patient's Cardiovascular Status Stable and Respiratory Function Stable  Post-op Vital Signs: Reviewed and stable  Complications: No apparent anesthesia complications

## 2013-09-12 ENCOUNTER — Encounter (HOSPITAL_COMMUNITY): Payer: Self-pay | Admitting: Obstetrics and Gynecology

## 2013-09-12 LAB — CBC
HCT: 21.6 % — ABNORMAL LOW (ref 36.0–46.0)
Platelets: 102 10*3/uL — ABNORMAL LOW (ref 150–400)
RBC: 2.46 MIL/uL — ABNORMAL LOW (ref 3.87–5.11)
RDW: 13.3 % (ref 11.5–15.5)
WBC: 16.3 10*3/uL — ABNORMAL HIGH (ref 4.0–10.5)

## 2013-09-12 MED ORDER — FERROUS SULFATE 325 (65 FE) MG PO TABS
325.0000 mg | ORAL_TABLET | Freq: Three times a day (TID) | ORAL | Status: DC
  Administered 2013-09-12 – 2013-09-14 (×5): 325 mg via ORAL
  Filled 2013-09-12 (×5): qty 1

## 2013-09-12 NOTE — Progress Notes (Signed)
Subjective: Postpartum Day 1: Cesarean Delivery Patient reports tolerating PO.  Complained of dizziness last pm. Is able this am to ambulate without dizziness . Tolerated dinner last pm well  Objective: Vital signs in last 24 hours: Temp:  [97.1 F (36.2 C)-98.6 F (37 C)] 98.6 F (37 C) (09/17 0518) Pulse Rate:  [70-118] 83 (09/17 0518) Resp:  [12-22] 15 (09/17 0518) BP: (93-123)/(53-81) 112/73 mmHg (09/17 0518) SpO2:  [98 %-100 %] 100 % (09/17 0518)  Physical Exam:  General: alert and cooperative Lochia: appropriate Uterine Fundus: firm Incision: healing well DVT Evaluation: No evidence of DVT seen on physical exam. Negative Homan's sign. No cords or calf tenderness. No significant calf/ankle edema.   Recent Labs  09/11/13 0610 09/12/13 0630  HGB 12.5 7.5*  HCT 36.6 21.6*    Assessment/Plan: Status post Cesarean section. Postoperative course complicated by anemia  CBC in am FEso4.  Maxime Beckner G 09/12/2013, 8:18 AM

## 2013-09-13 LAB — CBC
Hemoglobin: 7.4 g/dL — ABNORMAL LOW (ref 12.0–15.0)
MCH: 30.8 pg (ref 26.0–34.0)
RBC: 2.4 MIL/uL — ABNORMAL LOW (ref 3.87–5.11)

## 2013-09-13 NOTE — Progress Notes (Signed)
Subjective: Postpartum Day 2: Cesarean Delivery Patient reports incisional pain, tolerating PO and no problems voiding.    Objective: Vital signs in last 24 hours: Temp:  [97.6 F (36.4 C)-98.5 F (36.9 C)] 97.6 F (36.4 C) (09/18 0553) Pulse Rate:  [102] 102 (09/18 0553) Resp:  [16-17] 16 (09/18 0553) BP: (115-118)/(63-79) 118/79 mmHg (09/18 0553) SpO2:  [98 %] 98 % (09/17 1230)  Physical Exam:  General: alert and cooperative Lochia: appropriate Uterine Fundus: firm Incision: healing well DVT Evaluation: No evidence of DVT seen on physical exam. Negative Homan's sign. No cords or calf tenderness. No significant calf/ankle edema.   Recent Labs  09/12/13 0630 09/13/13 0605  HGB 7.5* 7.4*  HCT 21.6* 21.4*    Assessment/Plan: Status post Cesarean section. Doing well postoperatively.  Continue current care.  Ashly Yepez G 09/13/2013, 8:05 AM

## 2013-09-14 ENCOUNTER — Encounter (HOSPITAL_COMMUNITY)
Admission: RE | Admit: 2013-09-14 | Discharge: 2013-09-14 | Disposition: A | Discharge status: Home / Self Care | Payer: BC Managed Care – PPO | Source: Ambulatory Visit | Attending: Obstetrics and Gynecology | Admitting: Obstetrics and Gynecology

## 2013-09-14 ENCOUNTER — Ambulatory Visit: Payer: Self-pay

## 2013-09-14 DIAGNOSIS — O923 Agalactia: Secondary | ICD-10-CM | POA: Insufficient documentation

## 2013-09-14 MED ORDER — IBUPROFEN 600 MG PO TABS: 600.0000 mg | ORAL_TABLET | Freq: Four times a day (QID) | ORAL | Status: DC

## 2013-09-14 MED ORDER — FERROUS SULFATE 325 (65 FE) MG PO TABS: 325.0000 mg | ORAL_TABLET | Freq: Three times a day (TID) | ORAL | Status: DC

## 2013-09-14 NOTE — Lactation Note (Signed)
This note was copied from the chart of Veronica Cj Stupka. Lactation Consultation Note  Patient Name: Veronica Hicks Today's Date: 09/14/2013 Reason for consult: Follow-up assessment Per mom baby latches with a nipple shield , and we are also supplementing with breast milk or formula. Baby recently has fed per mom. At breast with a nipple shiled and supplementing with a bottle when LC wasn't present.  Reviewed basics and discussed written plan with mom, steps for latching and using the nipple shield  and curved syringe with EBM of formula. Stressed the importance of establishing milk supply and post pumping 6- 8 x's per day for 10 -15 mins.  Save milk and use it with latch or supplementing with bottle. Encouraged mom to always attempt at the breast 1st , if not in to latching , feed from a bottle  For calories , EBM or formula and may then try latching. Important for weight gain.  F/U apt 9/25 Thursday at 230p and 4p for the twins. Mom aware . Also encouraged to call with questions.    Maternal Data    Feeding    LATCH Score/Interventions                Intervention(s): Breastfeeding basics reviewed     Lactation Tools Discussed/Used     Consult Status Consult Status: Follow-up Date: 09/20/13 (230 p ) Follow-up type: Out-patient    Zoee Heeney Ann 09/14/2013, 1:50 PM    

## 2013-09-14 NOTE — Discharge Summary (Signed)
Obstetric Discharge Summary Reason for Admission: cesarean section Prenatal Procedures: ultrasound Intrapartum Procedures: cesarean: low cervical, transverse Postpartum Procedures: none Complications-Operative and Postpartum: none Hemoglobin  Date Value Range Status  09/13/2013 7.4* 12.0 - 15.0 g/dL Final     HCT  Date Value Range Status  09/13/2013 21.4* 36.0 - 46.0 % Final    Physical Exam:  General: alert and cooperative Lochia: appropriate Uterine Fundus: firm Incision: healing well DVT Evaluation: No evidence of DVT seen on physical exam. Negative Homan's sign. No cords or calf tenderness. Calf/Ankle edema is present.  Discharge Diagnoses: Term Pregnancy-delivered  Discharge Information: Date: 09/14/2013 Activity: pelvic rest Diet: routine Medications: PNV, Ibuprofen and Iron Condition: stable Instructions: refer to practice specific booklet Discharge to: home   Newborn Data:   Treena, Cosman [[id number]]  Live born female  Birth Weight: 4 lb 7.4 oz (2025 g) APGAR: 8, 9   Lamiyah, Schlotter [[id number]]  Live born female  Birth Weight: 6 lb 10.4 oz (3015 g) APGAR: 8, 8  Home with mother.  CURTIS,CAROL G 09/14/2013, 8:46 AM

## 2013-09-17 ENCOUNTER — Other Ambulatory Visit (HOSPITAL_COMMUNITY): Payer: BC Managed Care – PPO

## 2013-09-18 ENCOUNTER — Encounter (HOSPITAL_COMMUNITY): Admission: RE | Payer: Self-pay | Source: Ambulatory Visit

## 2013-09-18 ENCOUNTER — Inpatient Hospital Stay (HOSPITAL_COMMUNITY)
Admission: RE | Admit: 2013-09-18 | Payer: BC Managed Care – PPO | Source: Ambulatory Visit | Admitting: Obstetrics and Gynecology

## 2013-09-18 SURGERY — Surgical Case
Anesthesia: Regional

## 2013-09-20 ENCOUNTER — Ambulatory Visit (HOSPITAL_COMMUNITY): Payer: BC Managed Care – PPO

## 2013-09-20 ENCOUNTER — Ambulatory Visit (HOSPITAL_COMMUNITY)
Admission: RE | Admit: 2013-09-20 | Discharge: 2013-09-20 | Disposition: A | Discharge status: Home / Self Care | Payer: BC Managed Care – PPO | Source: Ambulatory Visit | Attending: Obstetrics and Gynecology | Admitting: Obstetrics and Gynecology

## 2013-09-20 NOTE — Lactation Note (Signed)
Adult Lactation Consultation Outpatient Visit Note  Patient Name: Veronica Hicks                                                 Twin girls -  Veronica Hicks Birth weight 6 lb. 1 oz.                   Date of Birth: Sep 04, 1976                                                                                 Veronica Hicks, Birth weight 4 lb. 7 oz.                                                                                                  Babies now 28 days old Gestational Age at Delivery: 103w0d Type of Delivery: C/S with EBL of 3000 ml on 09/11/13  Breastfeeding History: Frequency of Breastfeeding: Mom is breastfeeding every 3 hours for 15 minutes, supplementing after each feeding.                                                 Both babies Length of Feeding: 15 minutes most feedings. Veronica Hicks sometimes will not latch.  Voids: Veronica Hicks has 6 voids/day,  Veronica Hicks had 4 voids yesterday, but Mom reports she usually has more Stools: Veronica Hicks has 3 mustard/yellow stools per day.         Veronica Hicks has 5 mustard/yellow stools per day.   Supplementing / Method: Pumping:  Type of Pump:  Symphony DEBP   Frequency:  After each feeding during the day and evening  Volume:  30 ml each breast.   Comments: Mom is here for feeding assessment with twins:  Veronica Hicks is breastfeeding every 3 hours. Mom is using the #20 nipple shield to latch her. Mom reports she is fussy at the breast with some feedings and it takes her a while to latch. They have been preloading the nipple shield with some formula and this helps her to organize her suck and latch. She is breastfeeding on average for 15 minutes then becomes sleepy. Parents are supplementing after each feeding with EBM or formula 60 ml. They are using Enfamil Premium Newborn for Veronica Hicks.   Veronica Hicks is at the breast every 3 hours, but Mom reports with some feedings she will keep her mouth open wide at the breast but will not latch. Mom is using the nipple shield size 20 with Veronica Hicks as well. They are  pre-loading this nipple shield as  well. Mom reports when she does latch she will suckle well but there are some feedings she cannot get her to latch. Parents are supplementing with EBM or formula 45 ml each feeding. She is using Similac 22 cal formula for Veronica Hicks.   Consultation Evaluation: Veronica Hicks was at the right breast 1st. We started in football hold but Veronica Hicks was very fussy. Mom was using a scissor hold to support breast and hold the nipple shield. Had Mom change to "C" hold so Veronica Hicks could obtain more depth with the latch.  She would latch then come right off the breast. Changed to cross cradle but Veronica Hicks was more fussy in this position and it appeared awkward for Mom. We returned to football as this is the position Mom is using at home, we pre-loaded the nipple shield and Veronica Hicks latched demonstrating a good rhythmic suck. Reviewed positioning with Mom and ways to keep baby awake at the breast. Stressed importance of supporting breast for [facility] to obtain good depth and sustain her suckling pattern. Veronica Hicks breastfeed for 15 minutes, became sleepy. We weighed Veronica Hicks then returned her to the breast. Again it was necessary to pre-load the nipple shield. She nursed for another 5 minutes and transferred a total of 38 ml at the breast with breastfeeding for 20 minutes. FOB finished the feeding by supplementing with bottle via slow flow nipple, Veronica Hicks took 30 ml of Enfamil. Total feeding intake with breast and bottle for Veronica Hicks was 68 ml.   Veronica Hicks was placed in football hold to breastfeed on the left breast. As Mom had reported, she would have her mouth open wide and the breast but would not latch. She could not organize her suck. We were using a #16 nipple shield, changed to #20 with no improvement. Veronica Hicks could organize her suck on my finger after few minutes of stimulating her upper palate and tongue. Tried cross cradle, she again had the nipple shield in her mouth, her mouth wide open and would not latch. Got her to  suckle on my finger to organize her suck, then transferred her to the breast with the nipple shield. She began to suckle and developed a good rhythmic pattern. She sustained her latch and breastfed for 15 minutes transferring 18 ml at the breast. Attempted again to re-latch Veronica Hicks but she was very frustrated and could never organize her suck. FOB finished the feeding by supplementing her with Similac via bottle and slow flow nipple, 40 ml.  Total feeding intake for Veronica Hicks with breast and bottle was 58 ml.   Initial Feeding Assessment:                    Veronica Hicks:                                                     Veronica Hicks:  Pre-feed Weight:                               6 lb. 4.2 oz/2840 gm                         4 lb. 7.3 oz/2022 gm Post-feed Weight:  6 lb. 4.8 oz/2858 gm.                        4 lb. 8.0 oz/2040 gm                        Amount Transferred:                                 18 ml                                                      18 ml Comments:    See above  Additional Feeding Assessment: Pre-feed Weight:                                6 lb. 4.8 oz/2858 gm Post-feed Weight:                               6 lb. 5.5 oz/2878 gm Amount Transferred:                                   20 ml. Comments:  See above  Additional Feeding Assessment: Pre-feed Weight: Post-feed Weight: Amount Transferred: Comments:  Total Breast milk Transferred this Visit:   Veronica Hicks 38 ml.            Veronica Hicks  18 ml. Total Supplement Given:                          Veronica Hicks 30 ml.            Veronica Hicks   40 ml.   Additional Interventions: Discussed with Mom using an SNS to supplement at the breast to help babies latch and breastfeed more effectively. She felt this would be overwhelming and appreciated that FOB could help with supplement via bottle since she needs to post pump. Mom is taking Fenugreek 2 caps TID, she could not remember the dosage. Encouraged Mom to start More Milk Plus by  [employer] to encourage and support her milk production. To take as directed by company. Mom has history of IVF/infertility and EBL with c/s of 3000 ml.   For [facility], the plan is to continue to breastfeed whenever she is hungry but at least every 3 hours. Try to keep her active at the breast for up to 20 minutes, listen for swallows and look for breast milk in the nipple shield. Use the nipple shield to help with latch, pre-load as needed to get her to suckle at the breast. Continue to supplement 30-45 ml after each feeding. Post pump for 15-20 minutes during the day/evening.  For Veronica Hicks, the plan is to continue to breastfeed whenever she is hungry at least every 3 hours. If she will not latch after 5 minutes, then give her an appetizer with the bottle, let her organize her suck, then try to re-latch to the breast for 15-20 minutes. Continue her supplements after each feeding 45-60 ml as she is not sustaining her latch as well.  Post pump. Demonstrated to parents how to use the bottle nipple for suck training and how to pace feed.   Mom wants to continue to work with the babies at the breast, but may change to pump and bottle feed if breastfeeding does not become easier and FOB must return to work next week. Advised to pump every 3 hours for 15 minutes even at night right now till we establish a good milk supply if she decides to change to pump and bottle.   Offered to reschedule OP follow up for next week, Mom will call if she decides to come back in. [facility] RN to see Mom on Tuesday, 09/25/13.  Follow-Up  prn    Alfred Levins 09/20/2013, 5:26 PM

## 2013-10-15 ENCOUNTER — Encounter (HOSPITAL_COMMUNITY)
Admission: RE | Admit: 2013-10-15 | Discharge: 2013-10-15 | Disposition: A | Discharge status: Home / Self Care | Payer: BC Managed Care – PPO | Source: Ambulatory Visit | Attending: Obstetrics and Gynecology | Admitting: Obstetrics and Gynecology

## 2013-10-15 DIAGNOSIS — O923 Agalactia: Secondary | ICD-10-CM | POA: Insufficient documentation

## 2013-10-25 ENCOUNTER — Other Ambulatory Visit: Payer: Self-pay | Admitting: Obstetrics and Gynecology

## 2013-11-15 ENCOUNTER — Encounter (HOSPITAL_COMMUNITY)
Admission: RE | Admit: 2013-11-15 | Discharge: 2013-11-15 | Disposition: A | Discharge status: Home / Self Care | Payer: BC Managed Care – PPO | Source: Ambulatory Visit | Attending: Obstetrics and Gynecology | Admitting: Obstetrics and Gynecology

## 2013-11-15 DIAGNOSIS — O923 Agalactia: Secondary | ICD-10-CM | POA: Insufficient documentation

## 2013-12-16 ENCOUNTER — Encounter (HOSPITAL_COMMUNITY)
Admission: RE | Admit: 2013-12-16 | Discharge: 2013-12-16 | Disposition: A | Discharge status: Home / Self Care | Payer: BC Managed Care – PPO | Source: Ambulatory Visit | Attending: Obstetrics and Gynecology | Admitting: Obstetrics and Gynecology

## 2013-12-16 DIAGNOSIS — O923 Agalactia: Secondary | ICD-10-CM | POA: Insufficient documentation

## 2014-01-16 ENCOUNTER — Encounter (HOSPITAL_COMMUNITY)
Admission: RE | Admit: 2014-01-16 | Discharge: 2014-01-16 | Disposition: A | Discharge status: Home / Self Care | Payer: 59 | Source: Ambulatory Visit | Attending: Obstetrics and Gynecology | Admitting: Obstetrics and Gynecology

## 2014-01-16 DIAGNOSIS — O923 Agalactia: Secondary | ICD-10-CM | POA: Insufficient documentation

## 2014-02-16 ENCOUNTER — Encounter (HOSPITAL_COMMUNITY)
Admission: RE | Admit: 2014-02-16 | Discharge: 2014-02-16 | Disposition: A | Discharge status: Home / Self Care | Payer: 59 | Source: Ambulatory Visit | Attending: Obstetrics and Gynecology | Admitting: Obstetrics and Gynecology

## 2014-02-16 DIAGNOSIS — O923 Agalactia: Secondary | ICD-10-CM | POA: Insufficient documentation

## 2014-03-18 ENCOUNTER — Encounter (HOSPITAL_COMMUNITY)
Admission: RE | Admit: 2014-03-18 | Discharge: 2014-03-18 | Disposition: A | Discharge status: Home / Self Care | Payer: 59 | Source: Ambulatory Visit | Attending: Obstetrics and Gynecology | Admitting: Obstetrics and Gynecology

## 2014-03-18 DIAGNOSIS — O923 Agalactia: Secondary | ICD-10-CM | POA: Insufficient documentation

## 2014-04-18 ENCOUNTER — Encounter (HOSPITAL_COMMUNITY)
Admission: RE | Admit: 2014-04-18 | Discharge: 2014-04-18 | Disposition: A | Discharge status: Home / Self Care | Payer: BC Managed Care – PPO | Source: Ambulatory Visit | Attending: Obstetrics and Gynecology | Admitting: Obstetrics and Gynecology

## 2014-04-18 DIAGNOSIS — O923 Agalactia: Secondary | ICD-10-CM | POA: Insufficient documentation

## 2014-05-18 ENCOUNTER — Encounter (HOSPITAL_COMMUNITY)
Admission: RE | Admit: 2014-05-18 | Discharge: 2014-05-18 | Disposition: A | Discharge status: Home / Self Care | Payer: BC Managed Care – PPO | Source: Ambulatory Visit | Attending: Obstetrics and Gynecology | Admitting: Obstetrics and Gynecology

## 2014-05-18 DIAGNOSIS — O923 Agalactia: Secondary | ICD-10-CM | POA: Insufficient documentation

## 2014-06-18 ENCOUNTER — Encounter (HOSPITAL_COMMUNITY)
Admission: RE | Admit: 2014-06-18 | Discharge: 2014-06-18 | Disposition: A | Discharge status: Home / Self Care | Payer: BC Managed Care – PPO | Source: Ambulatory Visit | Attending: Obstetrics and Gynecology | Admitting: Obstetrics and Gynecology

## 2014-06-18 DIAGNOSIS — O923 Agalactia: Secondary | ICD-10-CM | POA: Insufficient documentation

## 2014-06-26 ENCOUNTER — Other Ambulatory Visit: Payer: Self-pay | Admitting: Obstetrics and Gynecology

## 2014-06-26 DIAGNOSIS — N63 Unspecified lump in unspecified breast: Secondary | ICD-10-CM

## 2014-06-27 ENCOUNTER — Ambulatory Visit
Admission: RE | Admit: 2014-06-27 | Discharge: 2014-06-27 | Disposition: A | Discharge status: Home / Self Care | Payer: BC Managed Care – PPO | Source: Ambulatory Visit | Attending: Obstetrics and Gynecology | Admitting: Obstetrics and Gynecology

## 2014-06-27 DIAGNOSIS — N63 Unspecified lump in unspecified breast: Secondary | ICD-10-CM

## 2014-07-18 ENCOUNTER — Encounter (HOSPITAL_COMMUNITY)
Admission: RE | Admit: 2014-07-18 | Discharge: 2014-07-18 | Disposition: A | Discharge status: Home / Self Care | Payer: BC Managed Care – PPO | Source: Ambulatory Visit | Attending: Obstetrics and Gynecology | Admitting: Obstetrics and Gynecology

## 2014-07-18 DIAGNOSIS — O923 Agalactia: Secondary | ICD-10-CM | POA: Insufficient documentation

## 2014-08-18 ENCOUNTER — Encounter (HOSPITAL_COMMUNITY)
Admission: RE | Admit: 2014-08-18 | Discharge: 2014-08-18 | Disposition: A | Discharge status: Home / Self Care | Payer: BC Managed Care – PPO | Source: Ambulatory Visit | Attending: Obstetrics and Gynecology | Admitting: Obstetrics and Gynecology

## 2014-08-18 DIAGNOSIS — O923 Agalactia: Secondary | ICD-10-CM | POA: Diagnosis present

## 2014-09-18 ENCOUNTER — Encounter (HOSPITAL_COMMUNITY)
Admission: RE | Admit: 2014-09-18 | Discharge: 2014-09-18 | Disposition: A | Discharge status: Home / Self Care | Payer: BC Managed Care – PPO | Source: Ambulatory Visit | Attending: Obstetrics and Gynecology | Admitting: Obstetrics and Gynecology

## 2014-09-18 DIAGNOSIS — O923 Agalactia: Secondary | ICD-10-CM | POA: Insufficient documentation

## 2014-10-28 ENCOUNTER — Encounter (HOSPITAL_COMMUNITY): Payer: Self-pay | Admitting: Obstetrics and Gynecology

## 2015-05-07 ENCOUNTER — Other Ambulatory Visit: Payer: Self-pay | Admitting: Obstetrics and Gynecology

## 2015-05-08 LAB — CYTOLOGY - PAP

## 2015-09-01 ENCOUNTER — Ambulatory Visit (INDEPENDENT_AMBULATORY_CARE_PROVIDER_SITE_OTHER): Payer: BLUE CROSS/BLUE SHIELD | Admitting: Family Medicine

## 2015-09-01 VITALS — BP 112/76 | HR 77 | Temp 97.4°F | Resp 16 | Ht 65.0 in | Wt 165.2 lb

## 2015-09-01 DIAGNOSIS — H6691 Otitis media, unspecified, right ear: Secondary | ICD-10-CM | POA: Diagnosis not present

## 2015-09-01 DIAGNOSIS — J069 Acute upper respiratory infection, unspecified: Secondary | ICD-10-CM

## 2015-09-01 MED ORDER — AMOXICILLIN-POT CLAVULANATE 875-125 MG PO TABS: 1.0000 | ORAL_TABLET | Freq: Two times a day (BID) | ORAL | Status: DC

## 2015-09-01 MED ORDER — MOMETASONE FUROATE 50 MCG/ACT NA SUSP: NASAL | Status: DC

## 2015-09-01 NOTE — Progress Notes (Signed)
Right ear pain Subjective:  Patient ID: Veronica Hicks, female    DOB: 1976-08-09  Age: 39 y.o. MRN: [id number]  39 year old lady who has been having problems with a respiratory tract infection for about a week. It is now settled into her right ear the last couple days. They other symptoms seem to be clearing for the most part. She had a fever early on but none now.   Objective:   Left TM normal. Right TM red and bulging. Alert and oriented. Throat clear. Neck supple without significant nodes. Chest clear to all station. Heart regular without murmur.  Assessment & Plan:   Assessment:  Acute right otitis media Upper respiratory infection improving  Plan:  Treat with Augmentin. Return if needed. Cautioned about her flying.  Patient Instructions  Drink lots of fluids to stay well hydrated  Take Tylenol or ibuprofen for painD  Take over-the-counter Claritin-D or Allegra-D or Zyrtec-D (loratadine D or fexofenadine D or cetirizine D) to help open the eustachian tubes  Take Augmentin 875 one twice daily with breakfast and supper  Before flying recommend using Afrin nose spray 2 sprays each nostril several hours ahead of time  Chew gum  Return as necessary     HOPPER,DAVID, MD 09/01/2015

## 2015-09-01 NOTE — Patient Instructions (Signed)
Drink lots of fluids to stay well hydrated  Take Tylenol or ibuprofen for painD  Take over-the-counter Claritin-D or Allegra-D or Zyrtec-D (loratadine D or fexofenadine D or cetirizine D) to help open the eustachian tubes  Take Augmentin 875 one twice daily with breakfast and supper  Before flying recommend using Afrin nose spray 2 sprays each nostril several hours ahead of time  Chew gum  Return as necessary

## 2018-03-11 ENCOUNTER — Encounter: Payer: Self-pay | Admitting: Physician Assistant

## 2018-03-11 ENCOUNTER — Other Ambulatory Visit: Payer: Self-pay

## 2018-03-11 ENCOUNTER — Ambulatory Visit: Payer: BLUE CROSS/BLUE SHIELD | Admitting: Physician Assistant

## 2018-03-11 VITALS — BP 108/80 | HR 82 | Temp 97.7°F | Resp 16 | Ht 65.0 in | Wt 176.0 lb

## 2018-03-11 DIAGNOSIS — R0981 Nasal congestion: Secondary | ICD-10-CM | POA: Diagnosis not present

## 2018-03-11 DIAGNOSIS — J22 Unspecified acute lower respiratory infection: Secondary | ICD-10-CM

## 2018-03-11 DIAGNOSIS — R05 Cough: Secondary | ICD-10-CM | POA: Diagnosis not present

## 2018-03-11 DIAGNOSIS — J029 Acute pharyngitis, unspecified: Secondary | ICD-10-CM | POA: Diagnosis not present

## 2018-03-11 DIAGNOSIS — R059 Cough, unspecified: Secondary | ICD-10-CM

## 2018-03-11 LAB — POCT RAPID STREP A (OFFICE): Rapid Strep A Screen: NEGATIVE

## 2018-03-11 MED ORDER — AZITHROMYCIN 250 MG PO TABS: ORAL_TABLET | ORAL | 0 refills | Status: DC

## 2018-03-11 MED ORDER — BENZOCAINE-MENTHOL 10-2.1 MG MT LOZG: 1.0000 | LOZENGE | OROMUCOSAL | 0 refills | Status: DC

## 2018-03-11 MED ORDER — IPRATROPIUM BROMIDE 0.03 % NA SOLN: 2.0000 | Freq: Two times a day (BID) | NASAL | 0 refills | Status: DC

## 2018-03-11 MED ORDER — MUCINEX DM MAXIMUM STRENGTH 60-1200 MG PO TB12: 1.0000 | ORAL_TABLET | Freq: Two times a day (BID) | ORAL | 1 refills | Status: DC

## 2018-03-11 NOTE — Patient Instructions (Addendum)
Negative strep throat Start taking azithromycin if you are not improving in 3-5 days on mucinex, atrovent and cepacol. Please do not take an antibiotic if you don't need it.   Stay well hydrated. Get lost of rest. Wash your hands often.   -Foods that can help speed recovery: honey, garlic, chicken soup, elderberries, green tea.  -Supplements that can help speed recovery: vitamin C, zinc, elderberry extract, quercetin, ginseng, selenium -Supplement with prebiotics and probiotics:   Advil or ibuprofen for pain. Do not take Aspirin.  Drink enough water and fluids to keep your urine clear or pale yellow.  For sore throat: ? Gargle with 8 oz of salt water ( tsp of salt per 1 qt of water) as often as every 1-2 hours to soothe your throat.  Gargle liquid benadryl.  Cepacol throat lozenges (if you are not at risk for choking).  For sore throat try using a honey-based tea. Use 3 teaspoons of honey with juice squeezed from half lemon. Place shaved pieces of ginger into 1/2-1 cup of water and warm over stove top. Then mix the ingredients and repeat every 4 hours as needed.  Cough Syrup Recipe: Sweet Lemon & Honey Thyme  Ingredients a handful of fresh thyme sprigs   1 pint of water (2 cups)  1/2 cup honey (raw is best, but regular will do)  1/2 lemon chopped Instructions 1. Place the lemon in the pint jar and cover with the honey. The honey will macerate the lemons and draw out liquids which taste so delicious! 2. Meanwhile, toss the thyme leaves into a saucepan and cover them with the water. 3. Bring the water to a gentle simmer and reduce it to half, about a cup of tea. 4. When the tea is reduced and cooled a bit, strain the sprigs & leaves, add it into the pint jar and stir it well. 5. Give it a shake and use a spoonful as needed. 6. Store your homemade cough syrup in the refrigerator for about a month.  What causes a cough? In adults, common causes of a cough include: ?An infection of  the airways or lungs (such as the common cold) ?Postnasal drip - Postnasal drip is when mucus from the nose drips down or flows along the back of the throat. Postnasal drip can happen when people have: .A cold .Allergies .A sinus infection - The sinuses are hollow areas in the bones of the face that open into the nose. ?Lung conditions, like asthma and chronic obstructive pulmonary disease (COPD) - Both of these conditions can make it hard to breathe. COPD is usually caused by smoking. ?Acid reflux - Acid reflux is when the acid that is normally in your stomach backs up into your esophagus (the tube that carries food from your mouth to your stomach). ?A side effect from blood pressure medicines called "ACE inhibitors" ?Smoking cigarettes  Is there anything I can do on my own to get rid of my cough? Yes. To help get rid of your cough, you can: ?Use a humidifier in your bedroom ?Use an over-the-counter cough medicine, or suck on cough drops or hard candy ?Stop smoking, if you smoke ?If you have allergies, avoid the things you are allergic to (like pollen, dust, animals, or mold) If you have acid reflux, your doctor or nurse will tell you which lifestyle changes can help reduce symptoms.     Antibiotics Aren't Always The Answer  CDC Urges Public To Be Antibiotics Aware The Centers for Disease  Control and Prevention (CDC) encourages patients and families to Be Antibiotics Aware by learning about safe antibiotic use. Each year in the [place], at least 2 million people get infected with antibiotic-resistant bacteria. At least 23,000 die as a result. Antibiotic resistance, one of the most urgent threats to the public's health, occurs when bacteria develop the ability to defeat the drugs designed to kill them.  What Do Antibiotics Treat? When you get a prescription for antibiotics, follow your doctor's instructions carefully.  Antibiotics are critical tools for treating a number of common  infections, such as pneumonia, and for life-threatening conditions including sepsis. Antibiotics are only needed for treating certain infections caused by bacteria.  What Don't Antibiotics Treat? Antibiotics do not work on viruses, such as colds and flu, or runny noses, even if the mucus is thick, yellow or green. Antibiotics also won't help some common bacterial infections including most cases of bronchitis, many sinus infections, and some ear infections.  What Are The Side Effects of Antibiotics? Any time antibiotics are used, they can cause side effects and lead to antibiotic resistance. When antibiotics aren't needed, they won't help you, and the side effects could still hurt you. Common side effects range from things like rashes and yeast infections to severe health problems. More serious side effects include Clostridium difficile infection (also called C. difficile or C. diff), which causes diarrhea that can lead to severe colon damage and death. If you need antibiotics, take them exactly as prescribed. Patients and families can talk to their healthcare professional if they have any questions about their antibiotics, or if they develop side effects, especially diarrhea, since that could be C. difficile, which needs to be treated.  Can I Feel Better Without Antibiotics? Patients and families can ask their healthcare professional about the best way to feel better while their body fights off the virus. Respiratory viruses usually go away in a week or two without treatment.  How Can I Stay Healthy? Regular hand-washing can go a long way toward protecting you from germs.  We can all stay healthy and keep others healthy by cleaning our hands, covering our coughs, staying home when sick, and getting recommended vaccines, for the flu, for example. Antibiotics save lives. When a patient needs antibiotics, the benefits outweigh the risks of side effects and antibiotic resistance. Improving the way we take  antibiotics helps keep [place] healthy now, helps fight antibiotic resistance, and ensures that life-saving antibiotics will be available for future generations.  To learn more about antibiotic prescribing and use, visit [email or web address]-[email or web address]   IF you received an x-ray today, you will receive an invoice from [facility]. Please contact [facility] at [phone] with questions or concerns regarding your invoice.   IF you received labwork today, you will receive an invoice from [place]. Please contact [facility] at [phone] with questions or concerns regarding your invoice.   Our billing staff will not be able to assist you with questions regarding bills from these companies.  You will be contacted with the lab results as soon as they are available. The fastest way to get your results is to activate your My Chart account. Instructions are located on the last page of this paperwork. If you have not heard from [place] regarding the results in 2 weeks, please contact this office.

## 2018-03-11 NOTE — Progress Notes (Signed)
Brantleigh Sultan  MRN: [id number] Eulah CitizenDOB: 04/07/1976  PCP: Patient, No Pcp Per  Subjective:  Pt is a 42 year old female who presents to clinic for stuffy nose, cough, sore throat and fatigue.  Stuffy nose x 8 days.  She endorses a productive cough.  Sore throat started today. Fatigue and fever is improving. She has taken Tylenol. Several members of her immediate family have been sick recently.    Review of Systems  Constitutional: Positive for fatigue. Negative for chills, diaphoresis and fever.  HENT: Positive for congestion, rhinorrhea and sore throat. Negative for postnasal drip, sinus pressure and sinus pain.   Respiratory: Positive for cough. Negative for shortness of breath and wheezing.   Psychiatric/Behavioral: Negative for sleep disturbance.    Patient Active Problem List   Diagnosis Date Noted  . Twin pregnancy, antepartum 08/08/2013    Current Outpatient Medications on File Prior to Visit  Medication Sig Dispense Refill  . calcium carbonate (OSCAL) 1500 (600 Ca) MG TABS tablet Take by mouth 2 (two) times daily with a meal.    . Multiple Vitamin (MULTIVITAMIN) tablet Take 1 tablet by mouth daily.    Marland Kitchen. conjugated estrogens-medroxyprogesteron (PREMPHASE) TABS Take 1 tablet by mouth daily.    . mometasone (NASONEX) 50 MCG/ACT nasal spray Use 2 sprays each nostril twice daily for 3 days, then decrease to once daily use (Patient not taking: Reported on 03/11/2018) 17 g 0   No current facility-administered medications on file prior to visit.     No Known Allergies   Objective:  BP 108/80   Pulse 82   Temp 97.7 F (36.5 C) (Oral)   Resp 16   Ht 5\' 5"  (1.651 m)   Wt 176 lb (79.8 kg)   LMP 01/25/2018   SpO2 97%   BMI 29.29 kg/m   Physical Exam  Constitutional: She is oriented to person, place, and time and well-developed, well-nourished, and in no distress. No distress.  HENT:  Right Ear: Tympanic membrane normal.  Left Ear: Tympanic membrane normal.  Nose: Mucosal  edema present. No rhinorrhea. Right sinus exhibits no maxillary sinus tenderness and no frontal sinus tenderness. Left sinus exhibits no maxillary sinus tenderness and no frontal sinus tenderness.  Mouth/Throat: Oropharynx is clear and moist and mucous membranes are normal.  Cardiovascular: Normal rate, regular rhythm and normal heart sounds.  Pulmonary/Chest: Effort normal and breath sounds normal. No respiratory distress. She has no wheezes. She has no rales.  Neurological: She is alert and oriented to person, place, and time. GCS score is 15.  Skin: Skin is warm and dry.  Psychiatric: Mood, memory, affect and judgment normal.  Vitals reviewed.  Results for orders placed or performed in visit on 03/11/18  POCT rapid strep A  Result Value Ref Range   Rapid Strep A Screen Negative Negative    Assessment and Plan :  1. Sore throat - Culture, Group A Strep - POCT rapid strep A - Benzocaine-Menthol (CEPACOL SORE THROAT) 10-2.1 MG LOZG; Use as directed 1 lozenge in the mouth or throat every 4 (four) hours.  Dispense: 18 each; Refill: 0 -Patient complains of runny nose sore throat and cough for 8 days.  She has taken Tylenol.  Will try a few days of supportive care with Mucinex Atrovent and cepacol.  Prescription written for azithromycin with note to pharmacy not to fill until 5 days from now.  She can fill this if she is still feeling ill.  Antibiotic stewardship discussed.  Return to  clinic in 5-7 days if still no improvement.  Consider imaging.  She agrees with plan 2. Cough - Dextromethorphan-Guaifenesin (MUCINEX DM MAXIMUM STRENGTH) 60-1200 MG TB12; Take 1 tablet by mouth every 12 (twelve) hours.  Dispense: 14 each; Refill: 1  3. Nasal congestion - ipratropium (ATROVENT) 0.03 % nasal spray; Place 2 sprays into both nostrils 2 (two) times daily.  Dispense: 30 mL; Refill: 0  4. Lower respiratory infection - azithromycin (ZITHROMAX) 250 MG tablet; Take 2 tabs PO x 1 dose, then 1 tab PO QD x  4 days  Dispense: 6 tablet; Refill: 0    Marco Collie, PA-C  Primary Care at [address] [facility] 03/11/2018 3:10 PM

## 2018-03-13 LAB — CULTURE, GROUP A STREP: Strep A Culture: NEGATIVE

## 2021-01-08 ENCOUNTER — Other Ambulatory Visit: Payer: Self-pay | Admitting: Obstetrics and Gynecology

## 2021-01-08 DIAGNOSIS — R928 Other abnormal and inconclusive findings on diagnostic imaging of breast: Secondary | ICD-10-CM

## 2021-01-21 ENCOUNTER — Ambulatory Visit: Payer: Self-pay

## 2021-01-21 ENCOUNTER — Ambulatory Visit
Admission: RE | Admit: 2021-01-21 | Discharge: 2021-01-21 | Disposition: A | Discharge status: Home / Self Care | Payer: PRIVATE HEALTH INSURANCE | Source: Ambulatory Visit | Attending: Obstetrics and Gynecology | Admitting: Obstetrics and Gynecology

## 2021-01-21 ENCOUNTER — Other Ambulatory Visit: Payer: Self-pay

## 2021-01-21 DIAGNOSIS — R928 Other abnormal and inconclusive findings on diagnostic imaging of breast: Secondary | ICD-10-CM

## 2021-12-11 ENCOUNTER — Other Ambulatory Visit: Payer: Self-pay | Admitting: Obstetrics and Gynecology

## 2021-12-11 DIAGNOSIS — Z1231 Encounter for screening mammogram for malignant neoplasm of breast: Secondary | ICD-10-CM

## 2022-01-13 ENCOUNTER — Ambulatory Visit: Payer: No Typology Code available for payment source

## 2022-01-13 ENCOUNTER — Ambulatory Visit
Admission: RE | Admit: 2022-01-13 | Discharge: 2022-01-13 | Disposition: A | Discharge status: Home / Self Care | Payer: No Typology Code available for payment source | Source: Ambulatory Visit | Attending: Obstetrics and Gynecology | Admitting: Obstetrics and Gynecology

## 2022-01-13 DIAGNOSIS — Z1231 Encounter for screening mammogram for malignant neoplasm of breast: Secondary | ICD-10-CM

## 2022-12-28 ENCOUNTER — Other Ambulatory Visit: Payer: Self-pay | Admitting: Obstetrics and Gynecology

## 2022-12-28 DIAGNOSIS — Z1231 Encounter for screening mammogram for malignant neoplasm of breast: Secondary | ICD-10-CM

## 2023-02-23 ENCOUNTER — Ambulatory Visit
Admission: RE | Admit: 2023-02-23 | Discharge: 2023-02-23 | Disposition: A | Discharge status: Home / Self Care | Payer: No Typology Code available for payment source | Source: Ambulatory Visit | Attending: Obstetrics and Gynecology | Admitting: Obstetrics and Gynecology

## 2023-02-23 DIAGNOSIS — Z1231 Encounter for screening mammogram for malignant neoplasm of breast: Secondary | ICD-10-CM

## 2023-10-04 ENCOUNTER — Encounter: Payer: Self-pay | Admitting: Gastroenterology

## 2023-11-09 ENCOUNTER — Ambulatory Visit (AMBULATORY_SURGERY_CENTER): Payer: No Typology Code available for payment source

## 2023-11-09 VITALS — Ht 65.0 in | Wt 190.0 lb

## 2023-11-09 DIAGNOSIS — Z1211 Encounter for screening for malignant neoplasm of colon: Secondary | ICD-10-CM

## 2023-11-09 MED ORDER — NA SULFATE-K SULFATE-MG SULF 17.5-3.13-1.6 GM/177ML PO SOLN: 1.0000 | Freq: Once | ORAL | 0 refills | Status: AC

## 2023-11-09 NOTE — Progress Notes (Signed)

## 2023-12-01 ENCOUNTER — Encounter: Payer: No Typology Code available for payment source | Admitting: Gastroenterology

## 2024-01-06 ENCOUNTER — Encounter: Payer: Self-pay | Admitting: Gastroenterology

## 2024-01-08 ENCOUNTER — Encounter: Payer: Self-pay | Admitting: Certified Registered Nurse Anesthetist

## 2024-01-11 ENCOUNTER — Other Ambulatory Visit: Payer: Self-pay | Admitting: Obstetrics and Gynecology

## 2024-01-11 DIAGNOSIS — Z1231 Encounter for screening mammogram for malignant neoplasm of breast: Secondary | ICD-10-CM

## 2024-01-12 ENCOUNTER — Encounter: Payer: Self-pay | Admitting: Gastroenterology

## 2024-01-12 ENCOUNTER — Ambulatory Visit (AMBULATORY_SURGERY_CENTER): Payer: No Typology Code available for payment source | Admitting: Gastroenterology

## 2024-01-12 VITALS — BP 103/69 | HR 62 | Temp 97.7°F | Resp 16 | Ht 65.0 in | Wt 195.0 lb

## 2024-01-12 DIAGNOSIS — Z1211 Encounter for screening for malignant neoplasm of colon: Secondary | ICD-10-CM

## 2024-01-12 DIAGNOSIS — K6389 Other specified diseases of intestine: Secondary | ICD-10-CM

## 2024-01-12 DIAGNOSIS — D122 Benign neoplasm of ascending colon: Secondary | ICD-10-CM

## 2024-01-12 DIAGNOSIS — K648 Other hemorrhoids: Secondary | ICD-10-CM

## 2024-01-12 DIAGNOSIS — K644 Residual hemorrhoidal skin tags: Secondary | ICD-10-CM

## 2024-01-12 MED ORDER — SODIUM CHLORIDE 0.9 % IV SOLN: 500.0000 mL | INTRAVENOUS | Status: DC

## 2024-01-12 NOTE — Patient Instructions (Addendum)
Resume previous diet Continue present medications Await pathology results Handouts/information given for polyps and hemorrhoids  YOU HAD AN ENDOSCOPIC PROCEDURE TODAY AT THE [facility]:   Refer to the procedure report that was given to you for any specific questions about what was found during the examination.  If the procedure report does not answer your questions, please call your gastroenterologist to clarify.  If you requested that your care partner not be given the details of your procedure findings, then the procedure report has been included in a sealed envelope for you to review at your convenience later.  YOU SHOULD EXPECT: Some feelings of bloating in the abdomen. Passage of more gas than usual.  Walking can help get rid of the air that was put into your GI tract during the procedure and reduce the bloating. If you had a lower endoscopy (such as a colonoscopy or flexible sigmoidoscopy) you may notice spotting of blood in your stool or on the toilet paper. If you underwent a bowel prep for your procedure, you may not have a normal bowel movement for a few days.  Please Note:  You might notice some irritation and congestion in your nose or some drainage.  This is from the oxygen used during your procedure.  There is no need for concern and it should clear up in a day or so.  SYMPTOMS TO REPORT IMMEDIATELY:  Following lower endoscopy (colonoscopy):  Excessive amounts of blood in the stool  Significant tenderness or worsening of abdominal pains  Swelling of the abdomen that is new, acute  Fever of 100F or higher  For urgent or emergent issues, a gastroenterologist can be reached at any hour by calling [phone]. Do not use MyChart messaging for urgent concerns.   DIET:  We do recommend a small meal at first, but then you may proceed to your regular diet.  Drink plenty of fluids but you should avoid alcoholic beverages for 24 hours.  ACTIVITY:  You should plan to take  it easy for the rest of today and you should NOT DRIVE or use heavy machinery until tomorrow (because of the sedation medicines used during the test).    FOLLOW UP: Our staff will call the number listed on your records the next business day following your procedure.  We will call around 7:15- 8:00 am to check on you and address any questions or concerns that you may have regarding the information given to you following your procedure. If we do not reach you, we will leave a message.     If any biopsies were taken you will be contacted by phone or by letter within the next 1-3 weeks.  Please call us at [phone] if you have not heard about the biopsies in 3 weeks.    SIGNATURES/CONFIDENTIALITY: You and/or your care partner have signed paperwork which will be entered into your electronic medical record.  These signatures attest to the fact that that the information above on your After Visit Summary has been reviewed and is understood.  Full responsibility of the confidentiality of this discharge information lies with you and/or your care-partner. 

## 2024-01-12 NOTE — Progress Notes (Signed)
Report given to PACU, vss 

## 2024-01-12 NOTE — Progress Notes (Signed)
[facility] History and Physical   Primary Care Physician:  Lewis Moccasin, MD   Reason for Procedure:  Colorectal cancer screening  Plan:    Screening colonoscopy with possible interventions as needed     HPI: Veronica Hicks is a very pleasant 48 y.o. female here for screening colonoscopy. Denies any nausea, vomiting, abdominal pain, melena or bright red blood per rectum  The risks and benefits as well as alternatives of endoscopic procedure(s) have been discussed and reviewed. All questions answered. The patient agrees to proceed.    Past Medical History:  Diagnosis Date   Infertility    IVF Donor eggs    Past Surgical History:  Procedure Laterality Date   CESAREAN SECTION N/A 09/11/2013   Procedure: CESAREAN SECTION;  Surgeon: Jeani Hawking, MD;  Location: [facility];  Service: Obstetrics;  Laterality: N/A;   left acl repair      Prior to Admission medications   Medication Sig Start Date End Date Taking? Authorizing Provider  calcium carbonate (OSCAL) 1500 (600 Ca) MG TABS tablet Take by mouth 2 (two) times daily with a meal.   Yes [provider]  Multiple Vitamin (MULTIVITAMIN) tablet Take 1 tablet by mouth daily.   Yes [provider]    Current Outpatient Medications  Medication Sig Dispense Refill   calcium carbonate (OSCAL) 1500 (600 Ca) MG TABS tablet Take by mouth 2 (two) times daily with a meal.     Multiple Vitamin (MULTIVITAMIN) tablet Take 1 tablet by mouth daily.     Current Facility-Administered Medications  Medication Dose Route Frequency Provider Last Rate Last Admin   0.9 %  sodium chloride infusion  500 mL Intravenous Continuous Bobetta Korf, Eleonore Chiquito, MD        Allergies as of 01/12/2024   (No Known Allergies)    Family History  Problem Relation Age of Onset   Colon cancer Neg Hx    Rectal cancer Neg Hx    Stomach cancer Neg Hx     Social History   Socioeconomic History   Marital status: Married     Spouse name: Not on file   Number of children: Not on file   Years of education: Not on file   Highest education level: Not on file  Occupational History   Not on file  Tobacco Use   Smoking status: Never   Smokeless tobacco: Never  Substance and Sexual Activity   Alcohol use: No   Drug use: No   Sexual activity: Yes    Birth control/protection: None    Comment: pregnant  Other Topics Concern   Not on file  Social History Narrative   Not on file   Social Drivers of Health   Financial Resource Strain: Not on file  Food Insecurity: Not on file  Transportation Needs: Not on file  Physical Activity: Not on file  Stress: Not on file  Social Connections: Unknown (05/11/2022)   Received from [facility], [facility]   Social Network    Social Network: Not on file  Intimate Partner Violence: Unknown (04/02/2022)   Received from [facility], [facility]   HITS    Physically Hurt: Not on file    Insult or Talk Down To: Not on file    Threaten Physical Harm: Not on file    Scream or Curse: Not on file    Review of Systems:  All other review of systems negative except as mentioned in the HPI.  Physical Exam: Vital  signs in last 24 hours: BP 100/61   Pulse 68   Temp 97.7 F (36.5 C)   Resp 14   Ht 5\' 5"  (1.651 m)   Wt 195 lb (88.5 kg)   SpO2 94%   BMI 32.45 kg/m  General:   Alert, NAD Lungs:  Clear .   Heart:  Regular rate and rhythm Abdomen:  Soft, nontender and nondistended. Neuro/Psych:  Alert and cooperative. Normal mood and affect. A and O x 3  Reviewed labs, radiology imaging, old records and pertinent past GI work up  Patient is appropriate for planned procedure(s) and anesthesia in an ambulatory setting   K. Scherry Ran , MD [phone]

## 2024-01-12 NOTE — Progress Notes (Signed)
Called to room to assist during endoscopic procedure.  Patient ID and intended procedure confirmed with present staff. Received instructions for my participation in the procedure from the performing physician.  

## 2024-01-12 NOTE — Op Note (Signed)
[facility] Patient Name: Veronica Hicks Procedure Date: 01/12/2024 8:38 AM MRN: [id number] Endoscopist: Napoleon Form , MD, [id number] Age: 48 Referring MD:  Date of Birth: 12/10/1976 Gender: Female Account #: [id number] Procedure:                Colonoscopy Indications:              Screening for colorectal malignant neoplasm Medicines:                Monitored Anesthesia Care Procedure:                Pre-Anesthesia Assessment:                           - Prior to the procedure, a History and Physical                            was performed, and patient medications and                            allergies were reviewed. The patient's tolerance of                            previous anesthesia was also reviewed. The risks                            and benefits of the procedure and the sedation                            options and risks were discussed with the patient.                            All questions were answered, and informed consent                            was obtained. Prior Anticoagulants: The patient has                            taken no anticoagulant or antiplatelet agents. ASA                            Grade Assessment: I - A normal, healthy patient.                            After reviewing the risks and benefits, the patient                            was deemed in satisfactory condition to undergo the                            procedure.                           After obtaining informed consent, the colonoscope  was passed under direct vision. Throughout the                            procedure, the patient's blood pressure, pulse, and                            oxygen saturations were monitored continuously. The                            Olympus Scope SN: [id number] was introduced through                            the anus and advanced to the the cecum, identified                            by appendiceal orifice  and ileocecal valve. The                            colonoscopy was performed without difficulty. The                            patient tolerated the procedure well. The quality                            of the bowel preparation was good. The ileocecal                            valve, appendiceal orifice, and rectum were                            photographed. Scope In: 8:44:12 AM Scope Out: 8:59:13 AM Scope Withdrawal Time: 0 hours 11 minutes 20 seconds  Total Procedure Duration: 0 hours 15 minutes 1 second  Findings:                 The perianal and digital rectal examinations were                            normal.                           A 2 mm polyp was found in the ascending colon. The                            polyp was sessile. The polyp was removed with a                            cold snare. Resection and retrieval were complete.                           Non-bleeding external and internal hemorrhoids were                            found during retroflexion. The hemorrhoids were  small. Complications:            No immediate complications. Estimated Blood Loss:     Estimated blood loss was minimal. Impression:               - One 2 mm polyp in the ascending colon, removed                            with a cold snare. Resected and retrieved.                           - Non-bleeding external and internal hemorrhoids. Recommendation:           - Patient has a contact number available for                            emergencies. The signs and symptoms of potential                            delayed complications were discussed with the                            patient. Return to normal activities tomorrow.                            Written discharge instructions were provided to the                            patient.                           - Resume previous diet.                           - Continue present medications.                            - Await pathology results.                           - Repeat colonoscopy in 7-10 years for surveillance                            based on pathology results. Napoleon Form, MD 01/12/2024 9:02:30 AM This report has been signed electronically.

## 2024-01-13 ENCOUNTER — Telehealth: Payer: Self-pay

## 2024-01-13 NOTE — Telephone Encounter (Signed)
Left message on follow up call. 

## 2024-01-16 LAB — SURGICAL PATHOLOGY

## 2024-02-01 ENCOUNTER — Encounter: Payer: Self-pay | Admitting: Gastroenterology

## 2024-02-29 ENCOUNTER — Ambulatory Visit: Payer: No Typology Code available for payment source

## 2024-02-29 ENCOUNTER — Ambulatory Visit
Admission: RE | Admit: 2024-02-29 | Discharge: 2024-02-29 | Disposition: A | Discharge status: Home / Self Care | Payer: No Typology Code available for payment source | Source: Ambulatory Visit | Attending: Obstetrics and Gynecology | Admitting: Obstetrics and Gynecology

## 2024-02-29 DIAGNOSIS — Z1231 Encounter for screening mammogram for malignant neoplasm of breast: Secondary | ICD-10-CM

## 2025-01-16 ENCOUNTER — Other Ambulatory Visit: Payer: Self-pay | Admitting: Family Medicine

## 2025-01-16 DIAGNOSIS — Z1231 Encounter for screening mammogram for malignant neoplasm of breast: Secondary | ICD-10-CM

## 2025-03-06 ENCOUNTER — Ambulatory Visit
